# Patient Record
Sex: Male | Born: 1965
Health system: Southern US, Community
[De-identification: ages and names within clinical notes are randomized; demographics above are authoritative.]

## PROBLEM LIST (undated history)

## (undated) DIAGNOSIS — I10 Essential (primary) hypertension: Secondary | ICD-10-CM

## (undated) DIAGNOSIS — N201 Calculus of ureter: Secondary | ICD-10-CM

## (undated) DIAGNOSIS — E785 Hyperlipidemia, unspecified: Secondary | ICD-10-CM

## (undated) HISTORY — PX: SHOULDER SURGERY: SHX246

## (undated) HISTORY — PX: BACK SURGERY: SHX140

---

## 2002-05-19 ENCOUNTER — Encounter: Payer: Self-pay | Admitting: Internal Medicine

## 2002-05-19 ENCOUNTER — Ambulatory Visit (HOSPITAL_COMMUNITY): Admission: RE | Admit: 2002-05-19 | Discharge: 2002-05-19 | Payer: Self-pay | Admitting: Internal Medicine

## 2002-08-07 ENCOUNTER — Ambulatory Visit (HOSPITAL_BASED_OUTPATIENT_CLINIC_OR_DEPARTMENT_OTHER): Admission: RE | Admit: 2002-08-07 | Discharge: 2002-08-07 | Payer: Self-pay | Admitting: Otolaryngology

## 2002-08-07 ENCOUNTER — Encounter (INDEPENDENT_AMBULATORY_CARE_PROVIDER_SITE_OTHER): Payer: Self-pay | Admitting: *Deleted

## 2007-08-12 ENCOUNTER — Ambulatory Visit (HOSPITAL_COMMUNITY): Admission: RE | Admit: 2007-08-12 | Discharge: 2007-08-12 | Payer: Self-pay | Admitting: Internal Medicine

## 2008-08-03 ENCOUNTER — Emergency Department (HOSPITAL_COMMUNITY): Admission: EM | Admit: 2008-08-03 | Discharge: 2008-08-03 | Payer: Self-pay | Admitting: Emergency Medicine

## 2009-06-24 ENCOUNTER — Emergency Department (HOSPITAL_COMMUNITY): Admission: EM | Admit: 2009-06-24 | Discharge: 2009-06-24 | Payer: Self-pay | Admitting: Emergency Medicine

## 2009-12-16 ENCOUNTER — Ambulatory Visit (HOSPITAL_COMMUNITY): Admission: RE | Admit: 2009-12-16 | Discharge: 2009-12-16 | Payer: Self-pay | Admitting: Internal Medicine

## 2010-02-18 ENCOUNTER — Emergency Department (HOSPITAL_COMMUNITY): Admission: EM | Admit: 2010-02-18 | Discharge: 2010-02-18 | Payer: Self-pay | Admitting: Emergency Medicine

## 2010-07-28 ENCOUNTER — Emergency Department (HOSPITAL_COMMUNITY)
Admission: EM | Admit: 2010-07-28 | Discharge: 2010-07-29 | Payer: Self-pay | Source: Home / Self Care | Admitting: Emergency Medicine

## 2010-08-01 ENCOUNTER — Ambulatory Visit (HOSPITAL_COMMUNITY): Admission: RE | Admit: 2010-08-01 | Payer: Self-pay | Source: Home / Self Care | Admitting: Orthopedic Surgery

## 2010-08-01 ENCOUNTER — Ambulatory Visit (HOSPITAL_COMMUNITY)
Admission: RE | Admit: 2010-08-01 | Discharge: 2010-08-01 | Payer: Self-pay | Source: Home / Self Care | Attending: Orthopedic Surgery | Admitting: Orthopedic Surgery

## 2010-11-02 ENCOUNTER — Emergency Department (HOSPITAL_COMMUNITY)
Admission: EM | Admit: 2010-11-02 | Discharge: 2010-11-02 | Disposition: A | Discharge status: Home / Self Care | Payer: 59 | Attending: Emergency Medicine | Admitting: Emergency Medicine

## 2010-11-02 DIAGNOSIS — R42 Dizziness and giddiness: Secondary | ICD-10-CM | POA: Insufficient documentation

## 2010-11-02 DIAGNOSIS — I10 Essential (primary) hypertension: Secondary | ICD-10-CM | POA: Insufficient documentation

## 2010-11-02 LAB — BASIC METABOLIC PANEL
Chloride: 103 mEq/L (ref 96–112)
Creatinine, Ser: 0.74 mg/dL (ref 0.4–1.5)
GFR calc Af Amer: >60 mL/min (ref >60)
Potassium: 4.2 mEq/L (ref 3.5–5.1)

## 2010-11-02 LAB — DIFFERENTIAL
Basophils Absolute: 0 10*3/uL (ref 0.0–0.1)
Basophils Relative: 1 % (ref 0–1)
Eosinophils Absolute: 0.1 10*3/uL (ref 0.0–0.7)
Eosinophils Relative: 3 % (ref 0–5)
Lymphocytes Relative: 28 % (ref 12–46)
Lymphs Abs: 1.2 10*3/uL (ref 0.7–4.0)
Monocytes Absolute: 0.6 10*3/uL (ref 0.1–1.0)
Monocytes Relative: 13 % — ABNORMAL HIGH (ref 3–12)
Neutro Abs: 2.3 10*3/uL (ref 1.7–7.7)
Neutrophils Relative %: 55 % (ref 43–77)

## 2010-11-02 LAB — CBC
Platelets: 187 10*3/uL (ref 150–400)
RBC: 5.02 MIL/uL (ref 4.22–5.81)
WBC: 4.2 10*3/uL (ref 4.0–10.5)

## 2010-12-15 NOTE — Op Note (Signed)
   NAME:  MARQUIST, BINSTOCK                        ACCOUNT NO.:  [id number]   MEDICAL RECORD NO.:  [id number]                   PATIENT TYPE:  AMB   LOCATION:  DSC                                  FACILITY:  [facility]   PHYSICIAN:  Kristine Garbe. Ezzard Standing, M.D.         DATE OF BIRTH:  [birth year]   DATE OF PROCEDURE:  08/07/2002  DATE OF DISCHARGE:                                 OPERATIVE REPORT   PREOPERATIVE DIAGNOSIS:  Left vocal cord lesion.   POSTOPERATIVE DIAGNOSIS:  Left vocal cord lesion.   OPERATION:  Direct laryngoscopy and biopsy.   SURGEON:  Kristine Garbe. Ezzard Standing, M.D.   ANESTHESIA:  General endotracheal.   COMPLICATIONS:  None.   BRIEF CLINICAL NOTE:  Logan Solis is a 45 year old gentleman who has had  a soreness in the left side of his throat now for about 3 or 4 months.  On  examination in my office, he has a white, erythematous lesion over the  posterior left vocal cord over the arytenoid process region.  He is taken to  the operating room at this time for direct laryngoscopy and biopsy as this  has not responded to voice rest and antacid therapy.   DESCRIPTION OF PROCEDURE:  After adequate endotracheal anesthesia, direct  laryngoscopy was performed.  Base of tongue and epiglottis were normal to  evaluation.  Both piriform sinuses were clear.  AE folds were normal.  On  evaluation of the vocal cords, the anterior vocal cords were normal in  appearance bilaterally.  Over the posterior left vocal cord over the  arytenoid process there was an erythematous, slightly raised lesion.  Biopsy  was obtained from the erythematous raised lesion and sent to Pathology.  Photos were obtained prior to biopsy.  This completed the procedure.  [name]  was subsequently awakened from anesthesia and transferred to the recovery  room postoperatively doing well.   DISPOSITION:  [name] is discharged home early this morning on Tylenol for  pain.  Placed him on Protonix 40 mg daily for 2  weeks, as he had some  difficulty tolerating the Nexium in the past.  We will have him follow up in  my office in 10 to 14 days for re-check and to review pathology.                                                Kristine Garbe. Ezzard Standing, M.D.    CEN/MEDQ  D:  08/07/2002  T:  08/07/2002  Job:  [id number]

## 2017-06-03 DIAGNOSIS — J3081 Allergic rhinitis due to animal (cat) (dog) hair and dander: Secondary | ICD-10-CM | POA: Diagnosis not present

## 2017-06-03 DIAGNOSIS — J301 Allergic rhinitis due to pollen: Secondary | ICD-10-CM | POA: Diagnosis not present

## 2017-06-03 DIAGNOSIS — J3089 Other allergic rhinitis: Secondary | ICD-10-CM | POA: Diagnosis not present

## 2017-06-04 DIAGNOSIS — M47816 Spondylosis without myelopathy or radiculopathy, lumbar region: Secondary | ICD-10-CM | POA: Diagnosis not present

## 2017-06-04 DIAGNOSIS — Z6830 Body mass index (BMI) 30.0-30.9, adult: Secondary | ICD-10-CM | POA: Diagnosis not present

## 2017-06-04 DIAGNOSIS — M5126 Other intervertebral disc displacement, lumbar region: Secondary | ICD-10-CM | POA: Diagnosis not present

## 2017-06-10 DIAGNOSIS — J3089 Other allergic rhinitis: Secondary | ICD-10-CM | POA: Diagnosis not present

## 2017-06-10 DIAGNOSIS — J301 Allergic rhinitis due to pollen: Secondary | ICD-10-CM | POA: Diagnosis not present

## 2017-06-10 DIAGNOSIS — J3081 Allergic rhinitis due to animal (cat) (dog) hair and dander: Secondary | ICD-10-CM | POA: Diagnosis not present

## 2017-06-17 DIAGNOSIS — J301 Allergic rhinitis due to pollen: Secondary | ICD-10-CM | POA: Diagnosis not present

## 2017-06-17 DIAGNOSIS — J3081 Allergic rhinitis due to animal (cat) (dog) hair and dander: Secondary | ICD-10-CM | POA: Diagnosis not present

## 2017-06-17 DIAGNOSIS — J3089 Other allergic rhinitis: Secondary | ICD-10-CM | POA: Diagnosis not present

## 2017-06-24 DIAGNOSIS — J301 Allergic rhinitis due to pollen: Secondary | ICD-10-CM | POA: Diagnosis not present

## 2017-06-24 DIAGNOSIS — J3089 Other allergic rhinitis: Secondary | ICD-10-CM | POA: Diagnosis not present

## 2017-06-24 DIAGNOSIS — J3081 Allergic rhinitis due to animal (cat) (dog) hair and dander: Secondary | ICD-10-CM | POA: Diagnosis not present

## 2017-07-01 DIAGNOSIS — J3081 Allergic rhinitis due to animal (cat) (dog) hair and dander: Secondary | ICD-10-CM | POA: Diagnosis not present

## 2017-07-01 DIAGNOSIS — J3089 Other allergic rhinitis: Secondary | ICD-10-CM | POA: Diagnosis not present

## 2017-07-01 DIAGNOSIS — J301 Allergic rhinitis due to pollen: Secondary | ICD-10-CM | POA: Diagnosis not present

## 2017-07-09 DIAGNOSIS — J3089 Other allergic rhinitis: Secondary | ICD-10-CM | POA: Diagnosis not present

## 2017-07-09 DIAGNOSIS — J3081 Allergic rhinitis due to animal (cat) (dog) hair and dander: Secondary | ICD-10-CM | POA: Diagnosis not present

## 2017-07-09 DIAGNOSIS — J301 Allergic rhinitis due to pollen: Secondary | ICD-10-CM | POA: Diagnosis not present

## 2017-07-16 DIAGNOSIS — J3089 Other allergic rhinitis: Secondary | ICD-10-CM | POA: Diagnosis not present

## 2017-07-16 DIAGNOSIS — J301 Allergic rhinitis due to pollen: Secondary | ICD-10-CM | POA: Diagnosis not present

## 2017-07-16 DIAGNOSIS — J3081 Allergic rhinitis due to animal (cat) (dog) hair and dander: Secondary | ICD-10-CM | POA: Diagnosis not present

## 2017-07-16 DIAGNOSIS — I10 Essential (primary) hypertension: Secondary | ICD-10-CM | POA: Diagnosis not present

## 2017-07-16 DIAGNOSIS — F419 Anxiety disorder, unspecified: Secondary | ICD-10-CM | POA: Diagnosis not present

## 2017-07-24 DIAGNOSIS — J3081 Allergic rhinitis due to animal (cat) (dog) hair and dander: Secondary | ICD-10-CM | POA: Diagnosis not present

## 2017-07-24 DIAGNOSIS — J301 Allergic rhinitis due to pollen: Secondary | ICD-10-CM | POA: Diagnosis not present

## 2017-07-24 DIAGNOSIS — J3089 Other allergic rhinitis: Secondary | ICD-10-CM | POA: Diagnosis not present

## 2017-07-29 DIAGNOSIS — J301 Allergic rhinitis due to pollen: Secondary | ICD-10-CM | POA: Diagnosis not present

## 2017-07-29 DIAGNOSIS — J3089 Other allergic rhinitis: Secondary | ICD-10-CM | POA: Diagnosis not present

## 2017-07-29 DIAGNOSIS — J3081 Allergic rhinitis due to animal (cat) (dog) hair and dander: Secondary | ICD-10-CM | POA: Diagnosis not present

## 2017-08-06 DIAGNOSIS — J301 Allergic rhinitis due to pollen: Secondary | ICD-10-CM | POA: Diagnosis not present

## 2017-08-06 DIAGNOSIS — J3081 Allergic rhinitis due to animal (cat) (dog) hair and dander: Secondary | ICD-10-CM | POA: Diagnosis not present

## 2017-08-06 DIAGNOSIS — J3089 Other allergic rhinitis: Secondary | ICD-10-CM | POA: Diagnosis not present

## 2017-08-12 DIAGNOSIS — J3089 Other allergic rhinitis: Secondary | ICD-10-CM | POA: Diagnosis not present

## 2017-08-12 DIAGNOSIS — J3081 Allergic rhinitis due to animal (cat) (dog) hair and dander: Secondary | ICD-10-CM | POA: Diagnosis not present

## 2017-08-12 DIAGNOSIS — J301 Allergic rhinitis due to pollen: Secondary | ICD-10-CM | POA: Diagnosis not present

## 2017-08-19 DIAGNOSIS — J3089 Other allergic rhinitis: Secondary | ICD-10-CM | POA: Diagnosis not present

## 2017-08-19 DIAGNOSIS — J3081 Allergic rhinitis due to animal (cat) (dog) hair and dander: Secondary | ICD-10-CM | POA: Diagnosis not present

## 2017-08-19 DIAGNOSIS — J301 Allergic rhinitis due to pollen: Secondary | ICD-10-CM | POA: Diagnosis not present

## 2017-08-26 DIAGNOSIS — J3081 Allergic rhinitis due to animal (cat) (dog) hair and dander: Secondary | ICD-10-CM | POA: Diagnosis not present

## 2017-08-26 DIAGNOSIS — J301 Allergic rhinitis due to pollen: Secondary | ICD-10-CM | POA: Diagnosis not present

## 2017-08-26 DIAGNOSIS — J3089 Other allergic rhinitis: Secondary | ICD-10-CM | POA: Diagnosis not present

## 2017-09-06 DIAGNOSIS — J3081 Allergic rhinitis due to animal (cat) (dog) hair and dander: Secondary | ICD-10-CM | POA: Diagnosis not present

## 2017-09-06 DIAGNOSIS — J3089 Other allergic rhinitis: Secondary | ICD-10-CM | POA: Diagnosis not present

## 2017-09-06 DIAGNOSIS — J301 Allergic rhinitis due to pollen: Secondary | ICD-10-CM | POA: Diagnosis not present

## 2017-09-10 DIAGNOSIS — J3089 Other allergic rhinitis: Secondary | ICD-10-CM | POA: Diagnosis not present

## 2017-09-10 DIAGNOSIS — J3081 Allergic rhinitis due to animal (cat) (dog) hair and dander: Secondary | ICD-10-CM | POA: Diagnosis not present

## 2017-09-10 DIAGNOSIS — J301 Allergic rhinitis due to pollen: Secondary | ICD-10-CM | POA: Diagnosis not present

## 2017-09-17 DIAGNOSIS — J3089 Other allergic rhinitis: Secondary | ICD-10-CM | POA: Diagnosis not present

## 2017-09-17 DIAGNOSIS — J3081 Allergic rhinitis due to animal (cat) (dog) hair and dander: Secondary | ICD-10-CM | POA: Diagnosis not present

## 2017-09-17 DIAGNOSIS — J301 Allergic rhinitis due to pollen: Secondary | ICD-10-CM | POA: Diagnosis not present

## 2017-09-23 DIAGNOSIS — J301 Allergic rhinitis due to pollen: Secondary | ICD-10-CM | POA: Diagnosis not present

## 2017-09-23 DIAGNOSIS — J3089 Other allergic rhinitis: Secondary | ICD-10-CM | POA: Diagnosis not present

## 2017-09-23 DIAGNOSIS — J3081 Allergic rhinitis due to animal (cat) (dog) hair and dander: Secondary | ICD-10-CM | POA: Diagnosis not present

## 2017-10-01 DIAGNOSIS — J019 Acute sinusitis, unspecified: Secondary | ICD-10-CM | POA: Diagnosis not present

## 2017-10-03 DIAGNOSIS — J301 Allergic rhinitis due to pollen: Secondary | ICD-10-CM | POA: Diagnosis not present

## 2017-10-07 DIAGNOSIS — J301 Allergic rhinitis due to pollen: Secondary | ICD-10-CM | POA: Diagnosis not present

## 2017-10-07 DIAGNOSIS — J3089 Other allergic rhinitis: Secondary | ICD-10-CM | POA: Diagnosis not present

## 2017-10-07 DIAGNOSIS — J3081 Allergic rhinitis due to animal (cat) (dog) hair and dander: Secondary | ICD-10-CM | POA: Diagnosis not present

## 2017-10-08 DIAGNOSIS — H6123 Impacted cerumen, bilateral: Secondary | ICD-10-CM | POA: Diagnosis not present

## 2017-10-14 DIAGNOSIS — J301 Allergic rhinitis due to pollen: Secondary | ICD-10-CM | POA: Diagnosis not present

## 2017-10-14 DIAGNOSIS — J3081 Allergic rhinitis due to animal (cat) (dog) hair and dander: Secondary | ICD-10-CM | POA: Diagnosis not present

## 2017-10-14 DIAGNOSIS — J3089 Other allergic rhinitis: Secondary | ICD-10-CM | POA: Diagnosis not present

## 2017-10-21 DIAGNOSIS — J3081 Allergic rhinitis due to animal (cat) (dog) hair and dander: Secondary | ICD-10-CM | POA: Diagnosis not present

## 2017-10-21 DIAGNOSIS — J301 Allergic rhinitis due to pollen: Secondary | ICD-10-CM | POA: Diagnosis not present

## 2017-10-21 DIAGNOSIS — J3089 Other allergic rhinitis: Secondary | ICD-10-CM | POA: Diagnosis not present

## 2017-10-28 DIAGNOSIS — J301 Allergic rhinitis due to pollen: Secondary | ICD-10-CM | POA: Diagnosis not present

## 2017-10-28 DIAGNOSIS — J3089 Other allergic rhinitis: Secondary | ICD-10-CM | POA: Diagnosis not present

## 2017-10-28 DIAGNOSIS — J3081 Allergic rhinitis due to animal (cat) (dog) hair and dander: Secondary | ICD-10-CM | POA: Diagnosis not present

## 2017-11-03 DIAGNOSIS — H6501 Acute serous otitis media, right ear: Secondary | ICD-10-CM | POA: Diagnosis not present

## 2017-11-03 DIAGNOSIS — R509 Fever, unspecified: Secondary | ICD-10-CM | POA: Diagnosis not present

## 2017-11-03 DIAGNOSIS — J060 Acute laryngopharyngitis: Secondary | ICD-10-CM | POA: Diagnosis not present

## 2017-11-11 DIAGNOSIS — J3081 Allergic rhinitis due to animal (cat) (dog) hair and dander: Secondary | ICD-10-CM | POA: Diagnosis not present

## 2017-11-11 DIAGNOSIS — J3089 Other allergic rhinitis: Secondary | ICD-10-CM | POA: Diagnosis not present

## 2017-11-11 DIAGNOSIS — J301 Allergic rhinitis due to pollen: Secondary | ICD-10-CM | POA: Diagnosis not present

## 2017-11-18 DIAGNOSIS — J3089 Other allergic rhinitis: Secondary | ICD-10-CM | POA: Diagnosis not present

## 2017-11-18 DIAGNOSIS — J3081 Allergic rhinitis due to animal (cat) (dog) hair and dander: Secondary | ICD-10-CM | POA: Diagnosis not present

## 2017-11-18 DIAGNOSIS — J301 Allergic rhinitis due to pollen: Secondary | ICD-10-CM | POA: Diagnosis not present

## 2017-11-19 DIAGNOSIS — Z6830 Body mass index (BMI) 30.0-30.9, adult: Secondary | ICD-10-CM | POA: Diagnosis not present

## 2017-11-19 DIAGNOSIS — F419 Anxiety disorder, unspecified: Secondary | ICD-10-CM | POA: Diagnosis not present

## 2017-11-19 DIAGNOSIS — I10 Essential (primary) hypertension: Secondary | ICD-10-CM | POA: Diagnosis not present

## 2017-11-25 DIAGNOSIS — J301 Allergic rhinitis due to pollen: Secondary | ICD-10-CM | POA: Diagnosis not present

## 2017-11-25 DIAGNOSIS — J3089 Other allergic rhinitis: Secondary | ICD-10-CM | POA: Diagnosis not present

## 2017-11-25 DIAGNOSIS — J3081 Allergic rhinitis due to animal (cat) (dog) hair and dander: Secondary | ICD-10-CM | POA: Diagnosis not present

## 2017-12-02 DIAGNOSIS — J3081 Allergic rhinitis due to animal (cat) (dog) hair and dander: Secondary | ICD-10-CM | POA: Diagnosis not present

## 2017-12-02 DIAGNOSIS — J3089 Other allergic rhinitis: Secondary | ICD-10-CM | POA: Diagnosis not present

## 2017-12-02 DIAGNOSIS — J301 Allergic rhinitis due to pollen: Secondary | ICD-10-CM | POA: Diagnosis not present

## 2017-12-09 DIAGNOSIS — J3089 Other allergic rhinitis: Secondary | ICD-10-CM | POA: Diagnosis not present

## 2017-12-09 DIAGNOSIS — J3081 Allergic rhinitis due to animal (cat) (dog) hair and dander: Secondary | ICD-10-CM | POA: Diagnosis not present

## 2017-12-09 DIAGNOSIS — J301 Allergic rhinitis due to pollen: Secondary | ICD-10-CM | POA: Diagnosis not present

## 2017-12-16 DIAGNOSIS — J3081 Allergic rhinitis due to animal (cat) (dog) hair and dander: Secondary | ICD-10-CM | POA: Diagnosis not present

## 2017-12-16 DIAGNOSIS — J3089 Other allergic rhinitis: Secondary | ICD-10-CM | POA: Diagnosis not present

## 2017-12-16 DIAGNOSIS — J301 Allergic rhinitis due to pollen: Secondary | ICD-10-CM | POA: Diagnosis not present

## 2017-12-24 DIAGNOSIS — J3089 Other allergic rhinitis: Secondary | ICD-10-CM | POA: Diagnosis not present

## 2017-12-24 DIAGNOSIS — J3081 Allergic rhinitis due to animal (cat) (dog) hair and dander: Secondary | ICD-10-CM | POA: Diagnosis not present

## 2017-12-24 DIAGNOSIS — J301 Allergic rhinitis due to pollen: Secondary | ICD-10-CM | POA: Diagnosis not present

## 2017-12-27 DIAGNOSIS — J3081 Allergic rhinitis due to animal (cat) (dog) hair and dander: Secondary | ICD-10-CM | POA: Diagnosis not present

## 2017-12-27 DIAGNOSIS — J3089 Other allergic rhinitis: Secondary | ICD-10-CM | POA: Diagnosis not present

## 2017-12-31 DIAGNOSIS — J3089 Other allergic rhinitis: Secondary | ICD-10-CM | POA: Diagnosis not present

## 2017-12-31 DIAGNOSIS — J3081 Allergic rhinitis due to animal (cat) (dog) hair and dander: Secondary | ICD-10-CM | POA: Diagnosis not present

## 2017-12-31 DIAGNOSIS — J301 Allergic rhinitis due to pollen: Secondary | ICD-10-CM | POA: Diagnosis not present

## 2018-01-01 DIAGNOSIS — J301 Allergic rhinitis due to pollen: Secondary | ICD-10-CM | POA: Diagnosis not present

## 2018-01-01 DIAGNOSIS — J3089 Other allergic rhinitis: Secondary | ICD-10-CM | POA: Diagnosis not present

## 2018-01-01 DIAGNOSIS — R05 Cough: Secondary | ICD-10-CM | POA: Diagnosis not present

## 2018-01-01 DIAGNOSIS — J3081 Allergic rhinitis due to animal (cat) (dog) hair and dander: Secondary | ICD-10-CM | POA: Diagnosis not present

## 2018-01-09 DIAGNOSIS — J301 Allergic rhinitis due to pollen: Secondary | ICD-10-CM | POA: Diagnosis not present

## 2018-01-09 DIAGNOSIS — J3089 Other allergic rhinitis: Secondary | ICD-10-CM | POA: Diagnosis not present

## 2018-01-09 DIAGNOSIS — J3081 Allergic rhinitis due to animal (cat) (dog) hair and dander: Secondary | ICD-10-CM | POA: Diagnosis not present

## 2018-01-16 DIAGNOSIS — M5126 Other intervertebral disc displacement, lumbar region: Secondary | ICD-10-CM | POA: Diagnosis not present

## 2018-01-16 DIAGNOSIS — Z6829 Body mass index (BMI) 29.0-29.9, adult: Secondary | ICD-10-CM | POA: Diagnosis not present

## 2018-01-21 DIAGNOSIS — J3089 Other allergic rhinitis: Secondary | ICD-10-CM | POA: Diagnosis not present

## 2018-01-21 DIAGNOSIS — J3081 Allergic rhinitis due to animal (cat) (dog) hair and dander: Secondary | ICD-10-CM | POA: Diagnosis not present

## 2018-01-21 DIAGNOSIS — J301 Allergic rhinitis due to pollen: Secondary | ICD-10-CM | POA: Diagnosis not present

## 2018-01-28 DIAGNOSIS — J301 Allergic rhinitis due to pollen: Secondary | ICD-10-CM | POA: Diagnosis not present

## 2018-01-28 DIAGNOSIS — J3081 Allergic rhinitis due to animal (cat) (dog) hair and dander: Secondary | ICD-10-CM | POA: Diagnosis not present

## 2018-01-28 DIAGNOSIS — J3089 Other allergic rhinitis: Secondary | ICD-10-CM | POA: Diagnosis not present

## 2018-02-04 DIAGNOSIS — J3081 Allergic rhinitis due to animal (cat) (dog) hair and dander: Secondary | ICD-10-CM | POA: Diagnosis not present

## 2018-02-04 DIAGNOSIS — J3089 Other allergic rhinitis: Secondary | ICD-10-CM | POA: Diagnosis not present

## 2018-02-04 DIAGNOSIS — J301 Allergic rhinitis due to pollen: Secondary | ICD-10-CM | POA: Diagnosis not present

## 2018-02-12 DIAGNOSIS — J3089 Other allergic rhinitis: Secondary | ICD-10-CM | POA: Diagnosis not present

## 2018-02-12 DIAGNOSIS — J301 Allergic rhinitis due to pollen: Secondary | ICD-10-CM | POA: Diagnosis not present

## 2018-02-18 DIAGNOSIS — J3081 Allergic rhinitis due to animal (cat) (dog) hair and dander: Secondary | ICD-10-CM | POA: Diagnosis not present

## 2018-02-18 DIAGNOSIS — J3089 Other allergic rhinitis: Secondary | ICD-10-CM | POA: Diagnosis not present

## 2018-02-18 DIAGNOSIS — J301 Allergic rhinitis due to pollen: Secondary | ICD-10-CM | POA: Diagnosis not present

## 2018-02-25 DIAGNOSIS — J3089 Other allergic rhinitis: Secondary | ICD-10-CM | POA: Diagnosis not present

## 2018-02-25 DIAGNOSIS — J3081 Allergic rhinitis due to animal (cat) (dog) hair and dander: Secondary | ICD-10-CM | POA: Diagnosis not present

## 2018-02-25 DIAGNOSIS — J301 Allergic rhinitis due to pollen: Secondary | ICD-10-CM | POA: Diagnosis not present

## 2018-03-04 DIAGNOSIS — J3089 Other allergic rhinitis: Secondary | ICD-10-CM | POA: Diagnosis not present

## 2018-03-04 DIAGNOSIS — J301 Allergic rhinitis due to pollen: Secondary | ICD-10-CM | POA: Diagnosis not present

## 2018-03-04 DIAGNOSIS — J3081 Allergic rhinitis due to animal (cat) (dog) hair and dander: Secondary | ICD-10-CM | POA: Diagnosis not present

## 2018-03-14 DIAGNOSIS — J3089 Other allergic rhinitis: Secondary | ICD-10-CM | POA: Diagnosis not present

## 2018-03-14 DIAGNOSIS — J301 Allergic rhinitis due to pollen: Secondary | ICD-10-CM | POA: Diagnosis not present

## 2018-03-14 DIAGNOSIS — J3081 Allergic rhinitis due to animal (cat) (dog) hair and dander: Secondary | ICD-10-CM | POA: Diagnosis not present

## 2018-03-19 DIAGNOSIS — J3089 Other allergic rhinitis: Secondary | ICD-10-CM | POA: Diagnosis not present

## 2018-03-19 DIAGNOSIS — J3081 Allergic rhinitis due to animal (cat) (dog) hair and dander: Secondary | ICD-10-CM | POA: Diagnosis not present

## 2018-03-19 DIAGNOSIS — J301 Allergic rhinitis due to pollen: Secondary | ICD-10-CM | POA: Diagnosis not present

## 2018-03-20 DIAGNOSIS — I10 Essential (primary) hypertension: Secondary | ICD-10-CM | POA: Diagnosis not present

## 2018-03-20 DIAGNOSIS — F419 Anxiety disorder, unspecified: Secondary | ICD-10-CM | POA: Diagnosis not present

## 2018-03-20 DIAGNOSIS — E785 Hyperlipidemia, unspecified: Secondary | ICD-10-CM | POA: Diagnosis not present

## 2018-03-20 DIAGNOSIS — Z79899 Other long term (current) drug therapy: Secondary | ICD-10-CM | POA: Diagnosis not present

## 2018-03-20 DIAGNOSIS — Z125 Encounter for screening for malignant neoplasm of prostate: Secondary | ICD-10-CM | POA: Diagnosis not present

## 2018-03-26 DIAGNOSIS — J3089 Other allergic rhinitis: Secondary | ICD-10-CM | POA: Diagnosis not present

## 2018-03-26 DIAGNOSIS — J301 Allergic rhinitis due to pollen: Secondary | ICD-10-CM | POA: Diagnosis not present

## 2018-03-26 DIAGNOSIS — J3081 Allergic rhinitis due to animal (cat) (dog) hair and dander: Secondary | ICD-10-CM | POA: Diagnosis not present

## 2018-03-26 DIAGNOSIS — R05 Cough: Secondary | ICD-10-CM | POA: Diagnosis not present

## 2018-04-01 DIAGNOSIS — R310 Gross hematuria: Secondary | ICD-10-CM | POA: Diagnosis not present

## 2018-04-01 DIAGNOSIS — I10 Essential (primary) hypertension: Secondary | ICD-10-CM | POA: Diagnosis not present

## 2018-04-02 DIAGNOSIS — J3089 Other allergic rhinitis: Secondary | ICD-10-CM | POA: Diagnosis not present

## 2018-04-02 DIAGNOSIS — J3081 Allergic rhinitis due to animal (cat) (dog) hair and dander: Secondary | ICD-10-CM | POA: Diagnosis not present

## 2018-04-02 DIAGNOSIS — J301 Allergic rhinitis due to pollen: Secondary | ICD-10-CM | POA: Diagnosis not present

## 2018-04-04 DIAGNOSIS — R319 Hematuria, unspecified: Secondary | ICD-10-CM | POA: Diagnosis not present

## 2018-04-04 DIAGNOSIS — N200 Calculus of kidney: Secondary | ICD-10-CM | POA: Diagnosis not present

## 2018-04-08 DIAGNOSIS — J3089 Other allergic rhinitis: Secondary | ICD-10-CM | POA: Diagnosis not present

## 2018-04-08 DIAGNOSIS — J3081 Allergic rhinitis due to animal (cat) (dog) hair and dander: Secondary | ICD-10-CM | POA: Diagnosis not present

## 2018-04-08 DIAGNOSIS — J301 Allergic rhinitis due to pollen: Secondary | ICD-10-CM | POA: Diagnosis not present

## 2018-04-16 DIAGNOSIS — J301 Allergic rhinitis due to pollen: Secondary | ICD-10-CM | POA: Diagnosis not present

## 2018-04-16 DIAGNOSIS — J3089 Other allergic rhinitis: Secondary | ICD-10-CM | POA: Diagnosis not present

## 2018-04-16 DIAGNOSIS — J3081 Allergic rhinitis due to animal (cat) (dog) hair and dander: Secondary | ICD-10-CM | POA: Diagnosis not present

## 2018-04-18 DIAGNOSIS — R319 Hematuria, unspecified: Secondary | ICD-10-CM | POA: Diagnosis not present

## 2018-04-21 DIAGNOSIS — J301 Allergic rhinitis due to pollen: Secondary | ICD-10-CM | POA: Diagnosis not present

## 2018-04-22 DIAGNOSIS — J3081 Allergic rhinitis due to animal (cat) (dog) hair and dander: Secondary | ICD-10-CM | POA: Diagnosis not present

## 2018-04-22 DIAGNOSIS — J3089 Other allergic rhinitis: Secondary | ICD-10-CM | POA: Diagnosis not present

## 2018-04-22 DIAGNOSIS — J301 Allergic rhinitis due to pollen: Secondary | ICD-10-CM | POA: Diagnosis not present

## 2018-04-24 DIAGNOSIS — Z23 Encounter for immunization: Secondary | ICD-10-CM | POA: Diagnosis not present

## 2018-04-29 DIAGNOSIS — J3089 Other allergic rhinitis: Secondary | ICD-10-CM | POA: Diagnosis not present

## 2018-04-29 DIAGNOSIS — J3081 Allergic rhinitis due to animal (cat) (dog) hair and dander: Secondary | ICD-10-CM | POA: Diagnosis not present

## 2018-04-29 DIAGNOSIS — J301 Allergic rhinitis due to pollen: Secondary | ICD-10-CM | POA: Diagnosis not present

## 2018-05-07 DIAGNOSIS — J3089 Other allergic rhinitis: Secondary | ICD-10-CM | POA: Diagnosis not present

## 2018-05-07 DIAGNOSIS — J301 Allergic rhinitis due to pollen: Secondary | ICD-10-CM | POA: Diagnosis not present

## 2018-05-07 DIAGNOSIS — J3081 Allergic rhinitis due to animal (cat) (dog) hair and dander: Secondary | ICD-10-CM | POA: Diagnosis not present

## 2018-05-13 DIAGNOSIS — J301 Allergic rhinitis due to pollen: Secondary | ICD-10-CM | POA: Diagnosis not present

## 2018-05-13 DIAGNOSIS — J3081 Allergic rhinitis due to animal (cat) (dog) hair and dander: Secondary | ICD-10-CM | POA: Diagnosis not present

## 2018-05-13 DIAGNOSIS — J3089 Other allergic rhinitis: Secondary | ICD-10-CM | POA: Diagnosis not present

## 2018-05-20 DIAGNOSIS — J3089 Other allergic rhinitis: Secondary | ICD-10-CM | POA: Diagnosis not present

## 2018-05-20 DIAGNOSIS — J3081 Allergic rhinitis due to animal (cat) (dog) hair and dander: Secondary | ICD-10-CM | POA: Diagnosis not present

## 2018-05-20 DIAGNOSIS — J301 Allergic rhinitis due to pollen: Secondary | ICD-10-CM | POA: Diagnosis not present

## 2018-05-27 DIAGNOSIS — J301 Allergic rhinitis due to pollen: Secondary | ICD-10-CM | POA: Diagnosis not present

## 2018-05-27 DIAGNOSIS — J3081 Allergic rhinitis due to animal (cat) (dog) hair and dander: Secondary | ICD-10-CM | POA: Diagnosis not present

## 2018-05-27 DIAGNOSIS — J3089 Other allergic rhinitis: Secondary | ICD-10-CM | POA: Diagnosis not present

## 2018-06-02 DIAGNOSIS — J3081 Allergic rhinitis due to animal (cat) (dog) hair and dander: Secondary | ICD-10-CM | POA: Diagnosis not present

## 2018-06-02 DIAGNOSIS — J3089 Other allergic rhinitis: Secondary | ICD-10-CM | POA: Diagnosis not present

## 2018-06-02 DIAGNOSIS — J301 Allergic rhinitis due to pollen: Secondary | ICD-10-CM | POA: Diagnosis not present

## 2018-06-09 DIAGNOSIS — J301 Allergic rhinitis due to pollen: Secondary | ICD-10-CM | POA: Diagnosis not present

## 2018-06-09 DIAGNOSIS — J3089 Other allergic rhinitis: Secondary | ICD-10-CM | POA: Diagnosis not present

## 2018-06-09 DIAGNOSIS — J3081 Allergic rhinitis due to animal (cat) (dog) hair and dander: Secondary | ICD-10-CM | POA: Diagnosis not present

## 2018-06-17 DIAGNOSIS — J3081 Allergic rhinitis due to animal (cat) (dog) hair and dander: Secondary | ICD-10-CM | POA: Diagnosis not present

## 2018-06-17 DIAGNOSIS — J301 Allergic rhinitis due to pollen: Secondary | ICD-10-CM | POA: Diagnosis not present

## 2018-06-17 DIAGNOSIS — J3089 Other allergic rhinitis: Secondary | ICD-10-CM | POA: Diagnosis not present

## 2018-06-23 DIAGNOSIS — J3089 Other allergic rhinitis: Secondary | ICD-10-CM | POA: Diagnosis not present

## 2018-06-23 DIAGNOSIS — J3081 Allergic rhinitis due to animal (cat) (dog) hair and dander: Secondary | ICD-10-CM | POA: Diagnosis not present

## 2018-06-23 DIAGNOSIS — J301 Allergic rhinitis due to pollen: Secondary | ICD-10-CM | POA: Diagnosis not present

## 2018-06-30 DIAGNOSIS — J3081 Allergic rhinitis due to animal (cat) (dog) hair and dander: Secondary | ICD-10-CM | POA: Diagnosis not present

## 2018-06-30 DIAGNOSIS — J3089 Other allergic rhinitis: Secondary | ICD-10-CM | POA: Diagnosis not present

## 2018-06-30 DIAGNOSIS — J301 Allergic rhinitis due to pollen: Secondary | ICD-10-CM | POA: Diagnosis not present

## 2018-07-01 DIAGNOSIS — M545 Low back pain: Secondary | ICD-10-CM | POA: Diagnosis not present

## 2018-07-01 DIAGNOSIS — I10 Essential (primary) hypertension: Secondary | ICD-10-CM | POA: Diagnosis not present

## 2018-07-01 DIAGNOSIS — Z6830 Body mass index (BMI) 30.0-30.9, adult: Secondary | ICD-10-CM | POA: Diagnosis not present

## 2018-07-07 DIAGNOSIS — J301 Allergic rhinitis due to pollen: Secondary | ICD-10-CM | POA: Diagnosis not present

## 2018-07-07 DIAGNOSIS — J3081 Allergic rhinitis due to animal (cat) (dog) hair and dander: Secondary | ICD-10-CM | POA: Diagnosis not present

## 2018-07-07 DIAGNOSIS — J3089 Other allergic rhinitis: Secondary | ICD-10-CM | POA: Diagnosis not present

## 2018-07-11 DIAGNOSIS — M5136 Other intervertebral disc degeneration, lumbar region: Secondary | ICD-10-CM | POA: Diagnosis not present

## 2018-07-15 DIAGNOSIS — J060 Acute laryngopharyngitis: Secondary | ICD-10-CM | POA: Diagnosis not present

## 2018-07-17 DIAGNOSIS — M5127 Other intervertebral disc displacement, lumbosacral region: Secondary | ICD-10-CM | POA: Diagnosis not present

## 2018-07-17 DIAGNOSIS — M545 Low back pain: Secondary | ICD-10-CM | POA: Diagnosis not present

## 2018-07-17 DIAGNOSIS — M47816 Spondylosis without myelopathy or radiculopathy, lumbar region: Secondary | ICD-10-CM | POA: Diagnosis not present

## 2018-07-21 DIAGNOSIS — J3089 Other allergic rhinitis: Secondary | ICD-10-CM | POA: Diagnosis not present

## 2018-07-21 DIAGNOSIS — J301 Allergic rhinitis due to pollen: Secondary | ICD-10-CM | POA: Diagnosis not present

## 2018-07-21 DIAGNOSIS — J3081 Allergic rhinitis due to animal (cat) (dog) hair and dander: Secondary | ICD-10-CM | POA: Diagnosis not present

## 2018-07-28 DIAGNOSIS — J301 Allergic rhinitis due to pollen: Secondary | ICD-10-CM | POA: Diagnosis not present

## 2018-07-28 DIAGNOSIS — J3089 Other allergic rhinitis: Secondary | ICD-10-CM | POA: Diagnosis not present

## 2018-07-28 DIAGNOSIS — J3081 Allergic rhinitis due to animal (cat) (dog) hair and dander: Secondary | ICD-10-CM | POA: Diagnosis not present

## 2018-07-31 DIAGNOSIS — I10 Essential (primary) hypertension: Secondary | ICD-10-CM | POA: Diagnosis not present

## 2018-07-31 DIAGNOSIS — N200 Calculus of kidney: Secondary | ICD-10-CM | POA: Diagnosis not present

## 2018-08-05 DIAGNOSIS — J3089 Other allergic rhinitis: Secondary | ICD-10-CM | POA: Diagnosis not present

## 2018-08-05 DIAGNOSIS — J3081 Allergic rhinitis due to animal (cat) (dog) hair and dander: Secondary | ICD-10-CM | POA: Diagnosis not present

## 2018-08-05 DIAGNOSIS — J301 Allergic rhinitis due to pollen: Secondary | ICD-10-CM | POA: Diagnosis not present

## 2018-08-07 DIAGNOSIS — M961 Postlaminectomy syndrome, not elsewhere classified: Secondary | ICD-10-CM | POA: Diagnosis not present

## 2018-08-07 DIAGNOSIS — M5126 Other intervertebral disc displacement, lumbar region: Secondary | ICD-10-CM | POA: Diagnosis not present

## 2018-08-07 DIAGNOSIS — M47816 Spondylosis without myelopathy or radiculopathy, lumbar region: Secondary | ICD-10-CM | POA: Diagnosis not present

## 2018-08-07 DIAGNOSIS — M5136 Other intervertebral disc degeneration, lumbar region: Secondary | ICD-10-CM | POA: Diagnosis not present

## 2018-08-12 DIAGNOSIS — J3081 Allergic rhinitis due to animal (cat) (dog) hair and dander: Secondary | ICD-10-CM | POA: Diagnosis not present

## 2018-08-12 DIAGNOSIS — J301 Allergic rhinitis due to pollen: Secondary | ICD-10-CM | POA: Diagnosis not present

## 2018-08-12 DIAGNOSIS — J3089 Other allergic rhinitis: Secondary | ICD-10-CM | POA: Diagnosis not present

## 2018-08-15 DIAGNOSIS — M5416 Radiculopathy, lumbar region: Secondary | ICD-10-CM | POA: Diagnosis not present

## 2018-08-15 DIAGNOSIS — M5126 Other intervertebral disc displacement, lumbar region: Secondary | ICD-10-CM | POA: Diagnosis not present

## 2018-08-19 DIAGNOSIS — J3081 Allergic rhinitis due to animal (cat) (dog) hair and dander: Secondary | ICD-10-CM | POA: Diagnosis not present

## 2018-08-19 DIAGNOSIS — M5124 Other intervertebral disc displacement, thoracic region: Secondary | ICD-10-CM | POA: Diagnosis not present

## 2018-08-19 DIAGNOSIS — J301 Allergic rhinitis due to pollen: Secondary | ICD-10-CM | POA: Diagnosis not present

## 2018-08-19 DIAGNOSIS — M5414 Radiculopathy, thoracic region: Secondary | ICD-10-CM | POA: Diagnosis not present

## 2018-08-19 DIAGNOSIS — J3089 Other allergic rhinitis: Secondary | ICD-10-CM | POA: Diagnosis not present

## 2018-08-19 DIAGNOSIS — M47814 Spondylosis without myelopathy or radiculopathy, thoracic region: Secondary | ICD-10-CM | POA: Diagnosis not present

## 2018-08-26 DIAGNOSIS — J3081 Allergic rhinitis due to animal (cat) (dog) hair and dander: Secondary | ICD-10-CM | POA: Diagnosis not present

## 2018-08-26 DIAGNOSIS — J3089 Other allergic rhinitis: Secondary | ICD-10-CM | POA: Diagnosis not present

## 2018-08-26 DIAGNOSIS — J301 Allergic rhinitis due to pollen: Secondary | ICD-10-CM | POA: Diagnosis not present

## 2018-09-08 DIAGNOSIS — J301 Allergic rhinitis due to pollen: Secondary | ICD-10-CM | POA: Diagnosis not present

## 2018-09-08 DIAGNOSIS — J3081 Allergic rhinitis due to animal (cat) (dog) hair and dander: Secondary | ICD-10-CM | POA: Diagnosis not present

## 2018-09-08 DIAGNOSIS — J3089 Other allergic rhinitis: Secondary | ICD-10-CM | POA: Diagnosis not present

## 2018-09-18 DIAGNOSIS — J3089 Other allergic rhinitis: Secondary | ICD-10-CM | POA: Diagnosis not present

## 2018-09-18 DIAGNOSIS — J3081 Allergic rhinitis due to animal (cat) (dog) hair and dander: Secondary | ICD-10-CM | POA: Diagnosis not present

## 2018-09-18 DIAGNOSIS — J301 Allergic rhinitis due to pollen: Secondary | ICD-10-CM | POA: Diagnosis not present

## 2018-09-24 DIAGNOSIS — J3081 Allergic rhinitis due to animal (cat) (dog) hair and dander: Secondary | ICD-10-CM | POA: Diagnosis not present

## 2018-09-24 DIAGNOSIS — J3089 Other allergic rhinitis: Secondary | ICD-10-CM | POA: Diagnosis not present

## 2018-09-24 DIAGNOSIS — J301 Allergic rhinitis due to pollen: Secondary | ICD-10-CM | POA: Diagnosis not present

## 2018-09-30 DIAGNOSIS — J301 Allergic rhinitis due to pollen: Secondary | ICD-10-CM | POA: Diagnosis not present

## 2018-09-30 DIAGNOSIS — J3081 Allergic rhinitis due to animal (cat) (dog) hair and dander: Secondary | ICD-10-CM | POA: Diagnosis not present

## 2018-09-30 DIAGNOSIS — J3089 Other allergic rhinitis: Secondary | ICD-10-CM | POA: Diagnosis not present

## 2018-10-07 DIAGNOSIS — J3081 Allergic rhinitis due to animal (cat) (dog) hair and dander: Secondary | ICD-10-CM | POA: Diagnosis not present

## 2018-10-07 DIAGNOSIS — J3089 Other allergic rhinitis: Secondary | ICD-10-CM | POA: Diagnosis not present

## 2018-10-07 DIAGNOSIS — J301 Allergic rhinitis due to pollen: Secondary | ICD-10-CM | POA: Diagnosis not present

## 2018-10-14 DIAGNOSIS — J3089 Other allergic rhinitis: Secondary | ICD-10-CM | POA: Diagnosis not present

## 2018-10-14 DIAGNOSIS — J301 Allergic rhinitis due to pollen: Secondary | ICD-10-CM | POA: Diagnosis not present

## 2018-10-14 DIAGNOSIS — J3081 Allergic rhinitis due to animal (cat) (dog) hair and dander: Secondary | ICD-10-CM | POA: Diagnosis not present

## 2018-10-21 DIAGNOSIS — J3089 Other allergic rhinitis: Secondary | ICD-10-CM | POA: Diagnosis not present

## 2018-10-21 DIAGNOSIS — J301 Allergic rhinitis due to pollen: Secondary | ICD-10-CM | POA: Diagnosis not present

## 2018-10-21 DIAGNOSIS — J3081 Allergic rhinitis due to animal (cat) (dog) hair and dander: Secondary | ICD-10-CM | POA: Diagnosis not present

## 2018-10-24 DIAGNOSIS — J301 Allergic rhinitis due to pollen: Secondary | ICD-10-CM | POA: Diagnosis not present

## 2018-10-28 DIAGNOSIS — J301 Allergic rhinitis due to pollen: Secondary | ICD-10-CM | POA: Diagnosis not present

## 2018-10-28 DIAGNOSIS — J3089 Other allergic rhinitis: Secondary | ICD-10-CM | POA: Diagnosis not present

## 2018-10-28 DIAGNOSIS — J3081 Allergic rhinitis due to animal (cat) (dog) hair and dander: Secondary | ICD-10-CM | POA: Diagnosis not present

## 2018-11-04 DIAGNOSIS — J3089 Other allergic rhinitis: Secondary | ICD-10-CM | POA: Diagnosis not present

## 2018-11-04 DIAGNOSIS — J3081 Allergic rhinitis due to animal (cat) (dog) hair and dander: Secondary | ICD-10-CM | POA: Diagnosis not present

## 2018-11-04 DIAGNOSIS — J301 Allergic rhinitis due to pollen: Secondary | ICD-10-CM | POA: Diagnosis not present

## 2018-11-11 DIAGNOSIS — J3089 Other allergic rhinitis: Secondary | ICD-10-CM | POA: Diagnosis not present

## 2018-11-11 DIAGNOSIS — J3081 Allergic rhinitis due to animal (cat) (dog) hair and dander: Secondary | ICD-10-CM | POA: Diagnosis not present

## 2018-11-11 DIAGNOSIS — J301 Allergic rhinitis due to pollen: Secondary | ICD-10-CM | POA: Diagnosis not present

## 2018-11-18 DIAGNOSIS — J3089 Other allergic rhinitis: Secondary | ICD-10-CM | POA: Diagnosis not present

## 2018-11-18 DIAGNOSIS — J3081 Allergic rhinitis due to animal (cat) (dog) hair and dander: Secondary | ICD-10-CM | POA: Diagnosis not present

## 2018-11-18 DIAGNOSIS — J301 Allergic rhinitis due to pollen: Secondary | ICD-10-CM | POA: Diagnosis not present

## 2018-11-19 DIAGNOSIS — J3081 Allergic rhinitis due to animal (cat) (dog) hair and dander: Secondary | ICD-10-CM | POA: Diagnosis not present

## 2018-11-19 DIAGNOSIS — J3089 Other allergic rhinitis: Secondary | ICD-10-CM | POA: Diagnosis not present

## 2018-11-24 DIAGNOSIS — I10 Essential (primary) hypertension: Secondary | ICD-10-CM | POA: Diagnosis not present

## 2018-11-24 DIAGNOSIS — F419 Anxiety disorder, unspecified: Secondary | ICD-10-CM | POA: Diagnosis not present

## 2018-11-25 DIAGNOSIS — J3081 Allergic rhinitis due to animal (cat) (dog) hair and dander: Secondary | ICD-10-CM | POA: Diagnosis not present

## 2018-11-25 DIAGNOSIS — J3089 Other allergic rhinitis: Secondary | ICD-10-CM | POA: Diagnosis not present

## 2018-11-25 DIAGNOSIS — J301 Allergic rhinitis due to pollen: Secondary | ICD-10-CM | POA: Diagnosis not present

## 2018-12-03 DIAGNOSIS — J3081 Allergic rhinitis due to animal (cat) (dog) hair and dander: Secondary | ICD-10-CM | POA: Diagnosis not present

## 2018-12-03 DIAGNOSIS — J3089 Other allergic rhinitis: Secondary | ICD-10-CM | POA: Diagnosis not present

## 2018-12-03 DIAGNOSIS — J301 Allergic rhinitis due to pollen: Secondary | ICD-10-CM | POA: Diagnosis not present

## 2018-12-09 DIAGNOSIS — J3089 Other allergic rhinitis: Secondary | ICD-10-CM | POA: Diagnosis not present

## 2018-12-09 DIAGNOSIS — J301 Allergic rhinitis due to pollen: Secondary | ICD-10-CM | POA: Diagnosis not present

## 2018-12-09 DIAGNOSIS — J3081 Allergic rhinitis due to animal (cat) (dog) hair and dander: Secondary | ICD-10-CM | POA: Diagnosis not present

## 2018-12-16 DIAGNOSIS — J3081 Allergic rhinitis due to animal (cat) (dog) hair and dander: Secondary | ICD-10-CM | POA: Diagnosis not present

## 2018-12-16 DIAGNOSIS — J3089 Other allergic rhinitis: Secondary | ICD-10-CM | POA: Diagnosis not present

## 2018-12-16 DIAGNOSIS — J301 Allergic rhinitis due to pollen: Secondary | ICD-10-CM | POA: Diagnosis not present

## 2018-12-23 DIAGNOSIS — J3081 Allergic rhinitis due to animal (cat) (dog) hair and dander: Secondary | ICD-10-CM | POA: Diagnosis not present

## 2018-12-23 DIAGNOSIS — J301 Allergic rhinitis due to pollen: Secondary | ICD-10-CM | POA: Diagnosis not present

## 2018-12-23 DIAGNOSIS — J3089 Other allergic rhinitis: Secondary | ICD-10-CM | POA: Diagnosis not present

## 2018-12-30 DIAGNOSIS — J3089 Other allergic rhinitis: Secondary | ICD-10-CM | POA: Diagnosis not present

## 2018-12-30 DIAGNOSIS — J3081 Allergic rhinitis due to animal (cat) (dog) hair and dander: Secondary | ICD-10-CM | POA: Diagnosis not present

## 2018-12-30 DIAGNOSIS — J301 Allergic rhinitis due to pollen: Secondary | ICD-10-CM | POA: Diagnosis not present

## 2019-01-02 DIAGNOSIS — Z6830 Body mass index (BMI) 30.0-30.9, adult: Secondary | ICD-10-CM | POA: Diagnosis not present

## 2019-01-06 DIAGNOSIS — J301 Allergic rhinitis due to pollen: Secondary | ICD-10-CM | POA: Diagnosis not present

## 2019-01-06 DIAGNOSIS — J3081 Allergic rhinitis due to animal (cat) (dog) hair and dander: Secondary | ICD-10-CM | POA: Diagnosis not present

## 2019-01-06 DIAGNOSIS — J3089 Other allergic rhinitis: Secondary | ICD-10-CM | POA: Diagnosis not present

## 2019-01-12 DIAGNOSIS — J3081 Allergic rhinitis due to animal (cat) (dog) hair and dander: Secondary | ICD-10-CM | POA: Diagnosis not present

## 2019-01-12 DIAGNOSIS — J3089 Other allergic rhinitis: Secondary | ICD-10-CM | POA: Diagnosis not present

## 2019-01-12 DIAGNOSIS — J301 Allergic rhinitis due to pollen: Secondary | ICD-10-CM | POA: Diagnosis not present

## 2019-01-20 DIAGNOSIS — J3081 Allergic rhinitis due to animal (cat) (dog) hair and dander: Secondary | ICD-10-CM | POA: Diagnosis not present

## 2019-01-20 DIAGNOSIS — J301 Allergic rhinitis due to pollen: Secondary | ICD-10-CM | POA: Diagnosis not present

## 2019-01-20 DIAGNOSIS — J3089 Other allergic rhinitis: Secondary | ICD-10-CM | POA: Diagnosis not present

## 2019-01-27 DIAGNOSIS — J3081 Allergic rhinitis due to animal (cat) (dog) hair and dander: Secondary | ICD-10-CM | POA: Diagnosis not present

## 2019-01-27 DIAGNOSIS — J3089 Other allergic rhinitis: Secondary | ICD-10-CM | POA: Diagnosis not present

## 2019-01-27 DIAGNOSIS — J301 Allergic rhinitis due to pollen: Secondary | ICD-10-CM | POA: Diagnosis not present

## 2019-02-03 DIAGNOSIS — J301 Allergic rhinitis due to pollen: Secondary | ICD-10-CM | POA: Diagnosis not present

## 2019-02-03 DIAGNOSIS — J3089 Other allergic rhinitis: Secondary | ICD-10-CM | POA: Diagnosis not present

## 2019-02-03 DIAGNOSIS — J3081 Allergic rhinitis due to animal (cat) (dog) hair and dander: Secondary | ICD-10-CM | POA: Diagnosis not present

## 2019-02-11 DIAGNOSIS — J3089 Other allergic rhinitis: Secondary | ICD-10-CM | POA: Diagnosis not present

## 2019-02-11 DIAGNOSIS — J3081 Allergic rhinitis due to animal (cat) (dog) hair and dander: Secondary | ICD-10-CM | POA: Diagnosis not present

## 2019-02-11 DIAGNOSIS — J301 Allergic rhinitis due to pollen: Secondary | ICD-10-CM | POA: Diagnosis not present

## 2019-02-17 DIAGNOSIS — J301 Allergic rhinitis due to pollen: Secondary | ICD-10-CM | POA: Diagnosis not present

## 2019-02-17 DIAGNOSIS — J3081 Allergic rhinitis due to animal (cat) (dog) hair and dander: Secondary | ICD-10-CM | POA: Diagnosis not present

## 2019-02-17 DIAGNOSIS — J3089 Other allergic rhinitis: Secondary | ICD-10-CM | POA: Diagnosis not present

## 2019-02-24 DIAGNOSIS — J3089 Other allergic rhinitis: Secondary | ICD-10-CM | POA: Diagnosis not present

## 2019-02-24 DIAGNOSIS — J3081 Allergic rhinitis due to animal (cat) (dog) hair and dander: Secondary | ICD-10-CM | POA: Diagnosis not present

## 2019-02-24 DIAGNOSIS — J301 Allergic rhinitis due to pollen: Secondary | ICD-10-CM | POA: Diagnosis not present

## 2019-03-03 DIAGNOSIS — J3081 Allergic rhinitis due to animal (cat) (dog) hair and dander: Secondary | ICD-10-CM | POA: Diagnosis not present

## 2019-03-03 DIAGNOSIS — J301 Allergic rhinitis due to pollen: Secondary | ICD-10-CM | POA: Diagnosis not present

## 2019-03-03 DIAGNOSIS — J3089 Other allergic rhinitis: Secondary | ICD-10-CM | POA: Diagnosis not present

## 2019-03-10 DIAGNOSIS — J301 Allergic rhinitis due to pollen: Secondary | ICD-10-CM | POA: Diagnosis not present

## 2019-03-10 DIAGNOSIS — J3081 Allergic rhinitis due to animal (cat) (dog) hair and dander: Secondary | ICD-10-CM | POA: Diagnosis not present

## 2019-03-10 DIAGNOSIS — J3089 Other allergic rhinitis: Secondary | ICD-10-CM | POA: Diagnosis not present

## 2019-03-11 DIAGNOSIS — F419 Anxiety disorder, unspecified: Secondary | ICD-10-CM | POA: Diagnosis not present

## 2019-03-11 DIAGNOSIS — Z125 Encounter for screening for malignant neoplasm of prostate: Secondary | ICD-10-CM | POA: Diagnosis not present

## 2019-03-11 DIAGNOSIS — I10 Essential (primary) hypertension: Secondary | ICD-10-CM | POA: Diagnosis not present

## 2019-03-11 DIAGNOSIS — E785 Hyperlipidemia, unspecified: Secondary | ICD-10-CM | POA: Diagnosis not present

## 2019-03-11 DIAGNOSIS — Z79899 Other long term (current) drug therapy: Secondary | ICD-10-CM | POA: Diagnosis not present

## 2019-03-17 DIAGNOSIS — J3089 Other allergic rhinitis: Secondary | ICD-10-CM | POA: Diagnosis not present

## 2019-03-17 DIAGNOSIS — J3081 Allergic rhinitis due to animal (cat) (dog) hair and dander: Secondary | ICD-10-CM | POA: Diagnosis not present

## 2019-03-17 DIAGNOSIS — J301 Allergic rhinitis due to pollen: Secondary | ICD-10-CM | POA: Diagnosis not present

## 2019-03-18 DIAGNOSIS — I10 Essential (primary) hypertension: Secondary | ICD-10-CM | POA: Diagnosis not present

## 2019-03-18 DIAGNOSIS — E785 Hyperlipidemia, unspecified: Secondary | ICD-10-CM | POA: Diagnosis not present

## 2019-03-25 DIAGNOSIS — R05 Cough: Secondary | ICD-10-CM | POA: Diagnosis not present

## 2019-03-25 DIAGNOSIS — J3089 Other allergic rhinitis: Secondary | ICD-10-CM | POA: Diagnosis not present

## 2019-03-25 DIAGNOSIS — J3081 Allergic rhinitis due to animal (cat) (dog) hair and dander: Secondary | ICD-10-CM | POA: Diagnosis not present

## 2019-03-25 DIAGNOSIS — J301 Allergic rhinitis due to pollen: Secondary | ICD-10-CM | POA: Diagnosis not present

## 2019-03-31 DIAGNOSIS — J301 Allergic rhinitis due to pollen: Secondary | ICD-10-CM | POA: Diagnosis not present

## 2019-03-31 DIAGNOSIS — J3089 Other allergic rhinitis: Secondary | ICD-10-CM | POA: Diagnosis not present

## 2019-03-31 DIAGNOSIS — J3081 Allergic rhinitis due to animal (cat) (dog) hair and dander: Secondary | ICD-10-CM | POA: Diagnosis not present

## 2019-04-07 DIAGNOSIS — J3089 Other allergic rhinitis: Secondary | ICD-10-CM | POA: Diagnosis not present

## 2019-04-07 DIAGNOSIS — J3081 Allergic rhinitis due to animal (cat) (dog) hair and dander: Secondary | ICD-10-CM | POA: Diagnosis not present

## 2019-04-07 DIAGNOSIS — J301 Allergic rhinitis due to pollen: Secondary | ICD-10-CM | POA: Diagnosis not present

## 2019-04-14 DIAGNOSIS — J301 Allergic rhinitis due to pollen: Secondary | ICD-10-CM | POA: Diagnosis not present

## 2019-04-14 DIAGNOSIS — J3081 Allergic rhinitis due to animal (cat) (dog) hair and dander: Secondary | ICD-10-CM | POA: Diagnosis not present

## 2019-04-14 DIAGNOSIS — J3089 Other allergic rhinitis: Secondary | ICD-10-CM | POA: Diagnosis not present

## 2019-04-21 DIAGNOSIS — J3089 Other allergic rhinitis: Secondary | ICD-10-CM | POA: Diagnosis not present

## 2019-04-21 DIAGNOSIS — J301 Allergic rhinitis due to pollen: Secondary | ICD-10-CM | POA: Diagnosis not present

## 2019-04-21 DIAGNOSIS — J3081 Allergic rhinitis due to animal (cat) (dog) hair and dander: Secondary | ICD-10-CM | POA: Diagnosis not present

## 2019-04-29 DIAGNOSIS — J3081 Allergic rhinitis due to animal (cat) (dog) hair and dander: Secondary | ICD-10-CM | POA: Diagnosis not present

## 2019-04-29 DIAGNOSIS — J301 Allergic rhinitis due to pollen: Secondary | ICD-10-CM | POA: Diagnosis not present

## 2019-04-29 DIAGNOSIS — J3089 Other allergic rhinitis: Secondary | ICD-10-CM | POA: Diagnosis not present

## 2019-05-04 DIAGNOSIS — J3089 Other allergic rhinitis: Secondary | ICD-10-CM | POA: Diagnosis not present

## 2019-05-04 DIAGNOSIS — J301 Allergic rhinitis due to pollen: Secondary | ICD-10-CM | POA: Diagnosis not present

## 2019-05-04 DIAGNOSIS — J3081 Allergic rhinitis due to animal (cat) (dog) hair and dander: Secondary | ICD-10-CM | POA: Diagnosis not present

## 2019-05-11 DIAGNOSIS — J3081 Allergic rhinitis due to animal (cat) (dog) hair and dander: Secondary | ICD-10-CM | POA: Diagnosis not present

## 2019-05-11 DIAGNOSIS — J301 Allergic rhinitis due to pollen: Secondary | ICD-10-CM | POA: Diagnosis not present

## 2019-05-11 DIAGNOSIS — J3089 Other allergic rhinitis: Secondary | ICD-10-CM | POA: Diagnosis not present

## 2019-05-18 DIAGNOSIS — J3081 Allergic rhinitis due to animal (cat) (dog) hair and dander: Secondary | ICD-10-CM | POA: Diagnosis not present

## 2019-05-18 DIAGNOSIS — J301 Allergic rhinitis due to pollen: Secondary | ICD-10-CM | POA: Diagnosis not present

## 2019-05-18 DIAGNOSIS — J3089 Other allergic rhinitis: Secondary | ICD-10-CM | POA: Diagnosis not present

## 2019-05-25 DIAGNOSIS — J3089 Other allergic rhinitis: Secondary | ICD-10-CM | POA: Diagnosis not present

## 2019-05-25 DIAGNOSIS — J3081 Allergic rhinitis due to animal (cat) (dog) hair and dander: Secondary | ICD-10-CM | POA: Diagnosis not present

## 2019-05-25 DIAGNOSIS — J301 Allergic rhinitis due to pollen: Secondary | ICD-10-CM | POA: Diagnosis not present

## 2019-06-01 DIAGNOSIS — J301 Allergic rhinitis due to pollen: Secondary | ICD-10-CM | POA: Diagnosis not present

## 2019-06-01 DIAGNOSIS — J3081 Allergic rhinitis due to animal (cat) (dog) hair and dander: Secondary | ICD-10-CM | POA: Diagnosis not present

## 2019-06-01 DIAGNOSIS — J3089 Other allergic rhinitis: Secondary | ICD-10-CM | POA: Diagnosis not present

## 2019-06-08 DIAGNOSIS — J301 Allergic rhinitis due to pollen: Secondary | ICD-10-CM | POA: Diagnosis not present

## 2019-06-08 DIAGNOSIS — J3089 Other allergic rhinitis: Secondary | ICD-10-CM | POA: Diagnosis not present

## 2019-06-08 DIAGNOSIS — J3081 Allergic rhinitis due to animal (cat) (dog) hair and dander: Secondary | ICD-10-CM | POA: Diagnosis not present

## 2019-06-16 DIAGNOSIS — J301 Allergic rhinitis due to pollen: Secondary | ICD-10-CM | POA: Diagnosis not present

## 2019-06-16 DIAGNOSIS — J3089 Other allergic rhinitis: Secondary | ICD-10-CM | POA: Diagnosis not present

## 2019-06-16 DIAGNOSIS — J3081 Allergic rhinitis due to animal (cat) (dog) hair and dander: Secondary | ICD-10-CM | POA: Diagnosis not present

## 2019-06-23 DIAGNOSIS — J301 Allergic rhinitis due to pollen: Secondary | ICD-10-CM | POA: Diagnosis not present

## 2019-06-23 DIAGNOSIS — J3081 Allergic rhinitis due to animal (cat) (dog) hair and dander: Secondary | ICD-10-CM | POA: Diagnosis not present

## 2019-06-23 DIAGNOSIS — J3089 Other allergic rhinitis: Secondary | ICD-10-CM | POA: Diagnosis not present

## 2019-07-06 DIAGNOSIS — J301 Allergic rhinitis due to pollen: Secondary | ICD-10-CM | POA: Diagnosis not present

## 2019-07-06 DIAGNOSIS — J3081 Allergic rhinitis due to animal (cat) (dog) hair and dander: Secondary | ICD-10-CM | POA: Diagnosis not present

## 2019-07-06 DIAGNOSIS — J3089 Other allergic rhinitis: Secondary | ICD-10-CM | POA: Diagnosis not present

## 2019-07-07 DIAGNOSIS — J3089 Other allergic rhinitis: Secondary | ICD-10-CM | POA: Diagnosis not present

## 2019-07-07 DIAGNOSIS — J3081 Allergic rhinitis due to animal (cat) (dog) hair and dander: Secondary | ICD-10-CM | POA: Diagnosis not present

## 2019-07-14 DIAGNOSIS — J3089 Other allergic rhinitis: Secondary | ICD-10-CM | POA: Diagnosis not present

## 2019-07-14 DIAGNOSIS — J3081 Allergic rhinitis due to animal (cat) (dog) hair and dander: Secondary | ICD-10-CM | POA: Diagnosis not present

## 2019-07-14 DIAGNOSIS — J301 Allergic rhinitis due to pollen: Secondary | ICD-10-CM | POA: Diagnosis not present

## 2019-07-20 DIAGNOSIS — J301 Allergic rhinitis due to pollen: Secondary | ICD-10-CM | POA: Diagnosis not present

## 2019-07-20 DIAGNOSIS — J3089 Other allergic rhinitis: Secondary | ICD-10-CM | POA: Diagnosis not present

## 2019-07-20 DIAGNOSIS — J3081 Allergic rhinitis due to animal (cat) (dog) hair and dander: Secondary | ICD-10-CM | POA: Diagnosis not present

## 2019-07-22 DIAGNOSIS — I10 Essential (primary) hypertension: Secondary | ICD-10-CM | POA: Diagnosis not present

## 2019-07-22 DIAGNOSIS — F419 Anxiety disorder, unspecified: Secondary | ICD-10-CM | POA: Diagnosis not present

## 2019-07-28 DIAGNOSIS — J3081 Allergic rhinitis due to animal (cat) (dog) hair and dander: Secondary | ICD-10-CM | POA: Diagnosis not present

## 2019-07-28 DIAGNOSIS — J301 Allergic rhinitis due to pollen: Secondary | ICD-10-CM | POA: Diagnosis not present

## 2019-07-28 DIAGNOSIS — J3089 Other allergic rhinitis: Secondary | ICD-10-CM | POA: Diagnosis not present

## 2019-08-04 DIAGNOSIS — J3081 Allergic rhinitis due to animal (cat) (dog) hair and dander: Secondary | ICD-10-CM | POA: Diagnosis not present

## 2019-08-04 DIAGNOSIS — J301 Allergic rhinitis due to pollen: Secondary | ICD-10-CM | POA: Diagnosis not present

## 2019-08-04 DIAGNOSIS — J3089 Other allergic rhinitis: Secondary | ICD-10-CM | POA: Diagnosis not present

## 2019-09-20 ENCOUNTER — Other Ambulatory Visit: Payer: Self-pay

## 2019-09-20 ENCOUNTER — Ambulatory Visit
Admission: EM | Admit: 2019-09-20 | Discharge: 2019-09-20 | Disposition: A | Discharge status: Home / Self Care | Payer: BC Managed Care – PPO | Attending: Emergency Medicine | Admitting: Emergency Medicine

## 2019-09-20 DIAGNOSIS — R6889 Other general symptoms and signs: Secondary | ICD-10-CM | POA: Diagnosis not present

## 2019-09-20 DIAGNOSIS — Z20822 Contact with and (suspected) exposure to covid-19: Secondary | ICD-10-CM | POA: Diagnosis not present

## 2019-09-20 DIAGNOSIS — J069 Acute upper respiratory infection, unspecified: Secondary | ICD-10-CM

## 2019-09-20 HISTORY — DX: Essential (primary) hypertension: I10

## 2019-09-20 MED ORDER — PREDNISONE 20 MG PO TABS: 20.0000 mg | ORAL_TABLET | Freq: Two times a day (BID) | ORAL | 0 refills | Status: AC

## 2019-09-20 NOTE — Discharge Instructions (Addendum)
COVID testing ordered.  It will take between 2-5 days for test results.  Someone will contact you regarding abnormal results.    In the meantime: You should remain isolated in your home for 10 days from symptom onset AND greater than 72 hours after symptoms resolution (absence of fever without the use of fever-reducing medication and improvement in respiratory symptoms), whichever is longer Get plenty of rest and push fluids Prednisone prescribed. Take as directed and to completion Use OTC medications like ibuprofen or tylenol as needed fever or pain Call or go to the ED if you have any new or worsening symptoms such as fever, cough, shortness of breath, chest tightness, chest pain, turning blue, changes in mental status, etc..Marland Kitchen

## 2019-09-20 NOTE — ED Provider Notes (Signed)
[facility]   [id number] 09/20/19 Arrival Time: 7741   CC: COVID symptoms  SUBJECTIVE: History from: patient.  Logan Solis is a 54 y.o. male who presents with sinus congestion, sore throat, and low grade fever of 99 x 2 days.  Denies sick exposure to COVID, flu or strep.  Denies recent travel.  Has tried xyzal and ibuprofen/ tylenol without relief.  Denies aggravating factors.  Denies previous COVID infection.   Reports previous symptoms in the past with sinus infection.  Denies chills, fatigue, SOB, wheezing, chest pain, nausea, changes in bowel or bladder habits.    ROS: As per HPI.  All other pertinent ROS negative.     Past Medical History:  Diagnosis Date  . Hypertension    Past Surgical History:  Procedure Laterality Date  . BACK SURGERY    . SHOULDER SURGERY     No Known Allergies No current facility-administered medications on file prior to encounter.   Current Outpatient Medications on File Prior to Encounter  Medication Sig Dispense Refill  . amLODipine (NORVASC) 10 MG tablet Take 10 mg by mouth daily.    . Azelastine-Fluticasone (DYMISTA NA) Place into the nose.    . citalopram (CELEXA) 20 MG tablet Take 20 mg by mouth daily.    Marland Kitchen levocetirizine (XYZAL) 5 MG tablet Take 5 mg by mouth every evening.    Marland Kitchen losartan-hydrochlorothiazide (HYZAAR) 100-25 MG tablet Take 1 tablet by mouth daily.     Social History   Socioeconomic History  . Marital status: Married    Spouse name: Not on file  . Number of children: Not on file  . Years of education: Not on file  . Highest education level: Not on file  Occupational History  . Not on file  Tobacco Use  . Smoking status: Never Smoker  . Smokeless tobacco: Never Used  Substance and Sexual Activity  . Alcohol use: Never  . Drug use: Never  . Sexual activity: Not on file  Other Topics Concern  . Not on file  Social History Narrative  . Not on file   Social Determinants of Health   Financial  Resource Strain:   . Difficulty of Paying Living Expenses: Not on file  Food Insecurity:   . Worried About Programme researcher, broadcasting/film/video in the Last Year: Not on file  . Ran Out of Food in the Last Year: Not on file  Transportation Needs:   . Lack of Transportation (Medical): Not on file  . Lack of Transportation (Non-Medical): Not on file  Physical Activity:   . Days of Exercise per Week: Not on file  . Minutes of Exercise per Session: Not on file  Stress:   . Feeling of Stress : Not on file  Social Connections:   . Frequency of Communication with Friends and Family: Not on file  . Frequency of Social Gatherings with Friends and Family: Not on file  . Attends Religious Services: Not on file  . Active Member of Clubs or Organizations: Not on file  . Attends Banker Meetings: Not on file  . Marital Status: Not on file  Intimate Partner Violence:   . Fear of Current or Ex-Partner: Not on file  . Emotionally Abused: Not on file  . Physically Abused: Not on file  . Sexually Abused: Not on file   Family History  Problem Relation Age of Onset  . Healthy Mother   . Healthy Father     OBJECTIVE:  Vitals:  09/20/19 0846  BP: 127/82  Pulse: 82  Resp: 17  Temp: 98.1 F (36.7 C)  TempSrc: Oral  SpO2: 97%     General appearance: alert; appears mildly fatigued, but nontoxic; speaking in full sentences and tolerating own secretions HEENT: NCAT; Ears: EACs clear, TMs pearly gray; Eyes: PERRL.  EOM grossly intact. Nose: nares patent without rhinorrhea, Throat: oropharynx clear, tonsils non erythematous or enlarged, uvula midline  Neck: supple without LAD Lungs: unlabored respirations, symmetrical air entry; cough: absent; no respiratory distress; CTAB Heart: regular rate and rhythm.   Skin: warm and dry Psychological: alert and cooperative; normal mood and affect  ASSESSMENT & PLAN:  1. Suspected COVID-19 virus infection   2. Viral URI     Meds ordered this encounter   Medications  . predniSONE (DELTASONE) 20 MG tablet    Sig: Take 1 tablet (20 mg total) by mouth 2 (two) times daily with a meal for 5 days.    Dispense:  10 tablet    Refill:  0    Order Specific Question:   Supervising Provider    Answer:   Raylene Everts [[id number]]    COVID testing ordered.  It will take between 2-5 days for test results.  Someone will contact you regarding abnormal results.    In the meantime: You should remain isolated in your home for 10 days from symptom onset AND greater than 72 hours after symptoms resolution (absence of fever without the use of fever-reducing medication and improvement in respiratory symptoms), whichever is longer Get plenty of rest and push fluids Prednisone prescribed. Take as directed and to completion Use OTC medications like ibuprofen or tylenol as needed fever or pain Call or go to the ED if you have any new or worsening symptoms such as fever, cough, shortness of breath, chest tightness, chest pain, turning blue, changes in mental status, etc...    Reviewed expectations re: course of current medical issues. Questions answered. Outlined signs and symptoms indicating need for more acute intervention. Patient verbalized understanding. After Visit Summary given.         Lestine Box, PA-C 09/20/19 1311

## 2019-09-20 NOTE — ED Triage Notes (Signed)
Pt presents with complaints of head and nasal congestion, and sore throat x 3 days. Reports concern for sinus infection.

## 2019-09-21 LAB — NOVEL CORONAVIRUS, NAA: SARS-CoV-2, NAA: NOT DETECTED

## 2019-11-09 DIAGNOSIS — J3081 Allergic rhinitis due to animal (cat) (dog) hair and dander: Secondary | ICD-10-CM | POA: Diagnosis not present

## 2019-11-09 DIAGNOSIS — J301 Allergic rhinitis due to pollen: Secondary | ICD-10-CM | POA: Diagnosis not present

## 2019-11-09 DIAGNOSIS — J3089 Other allergic rhinitis: Secondary | ICD-10-CM | POA: Diagnosis not present

## 2019-11-16 DIAGNOSIS — J3081 Allergic rhinitis due to animal (cat) (dog) hair and dander: Secondary | ICD-10-CM | POA: Diagnosis not present

## 2019-11-16 DIAGNOSIS — J3089 Other allergic rhinitis: Secondary | ICD-10-CM | POA: Diagnosis not present

## 2019-11-16 DIAGNOSIS — J301 Allergic rhinitis due to pollen: Secondary | ICD-10-CM | POA: Diagnosis not present

## 2019-11-23 DIAGNOSIS — F419 Anxiety disorder, unspecified: Secondary | ICD-10-CM | POA: Diagnosis not present

## 2019-11-23 DIAGNOSIS — Z6830 Body mass index (BMI) 30.0-30.9, adult: Secondary | ICD-10-CM | POA: Diagnosis not present

## 2019-11-23 DIAGNOSIS — I10 Essential (primary) hypertension: Secondary | ICD-10-CM | POA: Diagnosis not present

## 2019-11-24 DIAGNOSIS — J3089 Other allergic rhinitis: Secondary | ICD-10-CM | POA: Diagnosis not present

## 2019-11-24 DIAGNOSIS — J3081 Allergic rhinitis due to animal (cat) (dog) hair and dander: Secondary | ICD-10-CM | POA: Diagnosis not present

## 2019-11-24 DIAGNOSIS — J301 Allergic rhinitis due to pollen: Secondary | ICD-10-CM | POA: Diagnosis not present

## 2019-11-30 DIAGNOSIS — L820 Inflamed seborrheic keratosis: Secondary | ICD-10-CM | POA: Diagnosis not present

## 2019-11-30 DIAGNOSIS — L578 Other skin changes due to chronic exposure to nonionizing radiation: Secondary | ICD-10-CM | POA: Diagnosis not present

## 2019-11-30 DIAGNOSIS — L821 Other seborrheic keratosis: Secondary | ICD-10-CM | POA: Diagnosis not present

## 2019-11-30 DIAGNOSIS — L570 Actinic keratosis: Secondary | ICD-10-CM | POA: Diagnosis not present

## 2019-11-30 DIAGNOSIS — D225 Melanocytic nevi of trunk: Secondary | ICD-10-CM | POA: Diagnosis not present

## 2019-11-30 DIAGNOSIS — L814 Other melanin hyperpigmentation: Secondary | ICD-10-CM | POA: Diagnosis not present

## 2019-12-02 DIAGNOSIS — J3081 Allergic rhinitis due to animal (cat) (dog) hair and dander: Secondary | ICD-10-CM | POA: Diagnosis not present

## 2019-12-02 DIAGNOSIS — J3089 Other allergic rhinitis: Secondary | ICD-10-CM | POA: Diagnosis not present

## 2019-12-02 DIAGNOSIS — J301 Allergic rhinitis due to pollen: Secondary | ICD-10-CM | POA: Diagnosis not present

## 2019-12-09 DIAGNOSIS — J3081 Allergic rhinitis due to animal (cat) (dog) hair and dander: Secondary | ICD-10-CM | POA: Diagnosis not present

## 2019-12-09 DIAGNOSIS — J301 Allergic rhinitis due to pollen: Secondary | ICD-10-CM | POA: Diagnosis not present

## 2019-12-09 DIAGNOSIS — J3089 Other allergic rhinitis: Secondary | ICD-10-CM | POA: Diagnosis not present

## 2019-12-15 DIAGNOSIS — M5416 Radiculopathy, lumbar region: Secondary | ICD-10-CM | POA: Diagnosis not present

## 2019-12-15 DIAGNOSIS — M47816 Spondylosis without myelopathy or radiculopathy, lumbar region: Secondary | ICD-10-CM | POA: Diagnosis not present

## 2019-12-15 DIAGNOSIS — M5126 Other intervertebral disc displacement, lumbar region: Secondary | ICD-10-CM | POA: Diagnosis not present

## 2019-12-15 DIAGNOSIS — M9973 Connective tissue and disc stenosis of intervertebral foramina of lumbar region: Secondary | ICD-10-CM | POA: Diagnosis not present

## 2019-12-16 DIAGNOSIS — J3081 Allergic rhinitis due to animal (cat) (dog) hair and dander: Secondary | ICD-10-CM | POA: Diagnosis not present

## 2019-12-16 DIAGNOSIS — J301 Allergic rhinitis due to pollen: Secondary | ICD-10-CM | POA: Diagnosis not present

## 2019-12-16 DIAGNOSIS — J3089 Other allergic rhinitis: Secondary | ICD-10-CM | POA: Diagnosis not present

## 2019-12-22 DIAGNOSIS — J3081 Allergic rhinitis due to animal (cat) (dog) hair and dander: Secondary | ICD-10-CM | POA: Diagnosis not present

## 2019-12-22 DIAGNOSIS — J3089 Other allergic rhinitis: Secondary | ICD-10-CM | POA: Diagnosis not present

## 2019-12-22 DIAGNOSIS — J301 Allergic rhinitis due to pollen: Secondary | ICD-10-CM | POA: Diagnosis not present

## 2019-12-23 DIAGNOSIS — M5416 Radiculopathy, lumbar region: Secondary | ICD-10-CM | POA: Diagnosis not present

## 2019-12-30 DIAGNOSIS — J3089 Other allergic rhinitis: Secondary | ICD-10-CM | POA: Diagnosis not present

## 2019-12-30 DIAGNOSIS — J3081 Allergic rhinitis due to animal (cat) (dog) hair and dander: Secondary | ICD-10-CM | POA: Diagnosis not present

## 2019-12-30 DIAGNOSIS — J301 Allergic rhinitis due to pollen: Secondary | ICD-10-CM | POA: Diagnosis not present

## 2019-12-31 DIAGNOSIS — J301 Allergic rhinitis due to pollen: Secondary | ICD-10-CM | POA: Diagnosis not present

## 2020-01-01 DIAGNOSIS — J3081 Allergic rhinitis due to animal (cat) (dog) hair and dander: Secondary | ICD-10-CM | POA: Diagnosis not present

## 2020-01-01 DIAGNOSIS — J3089 Other allergic rhinitis: Secondary | ICD-10-CM | POA: Diagnosis not present

## 2020-01-08 DIAGNOSIS — J3089 Other allergic rhinitis: Secondary | ICD-10-CM | POA: Diagnosis not present

## 2020-01-08 DIAGNOSIS — J301 Allergic rhinitis due to pollen: Secondary | ICD-10-CM | POA: Diagnosis not present

## 2020-01-08 DIAGNOSIS — J3081 Allergic rhinitis due to animal (cat) (dog) hair and dander: Secondary | ICD-10-CM | POA: Diagnosis not present

## 2020-01-18 DIAGNOSIS — J3081 Allergic rhinitis due to animal (cat) (dog) hair and dander: Secondary | ICD-10-CM | POA: Diagnosis not present

## 2020-01-18 DIAGNOSIS — J3089 Other allergic rhinitis: Secondary | ICD-10-CM | POA: Diagnosis not present

## 2020-01-18 DIAGNOSIS — J301 Allergic rhinitis due to pollen: Secondary | ICD-10-CM | POA: Diagnosis not present

## 2020-01-26 DIAGNOSIS — J3081 Allergic rhinitis due to animal (cat) (dog) hair and dander: Secondary | ICD-10-CM | POA: Diagnosis not present

## 2020-01-26 DIAGNOSIS — J301 Allergic rhinitis due to pollen: Secondary | ICD-10-CM | POA: Diagnosis not present

## 2020-01-26 DIAGNOSIS — J3089 Other allergic rhinitis: Secondary | ICD-10-CM | POA: Diagnosis not present

## 2020-02-02 DIAGNOSIS — J301 Allergic rhinitis due to pollen: Secondary | ICD-10-CM | POA: Diagnosis not present

## 2020-02-02 DIAGNOSIS — J3081 Allergic rhinitis due to animal (cat) (dog) hair and dander: Secondary | ICD-10-CM | POA: Diagnosis not present

## 2020-02-02 DIAGNOSIS — J3089 Other allergic rhinitis: Secondary | ICD-10-CM | POA: Diagnosis not present

## 2020-02-08 DIAGNOSIS — J3081 Allergic rhinitis due to animal (cat) (dog) hair and dander: Secondary | ICD-10-CM | POA: Diagnosis not present

## 2020-02-08 DIAGNOSIS — J3089 Other allergic rhinitis: Secondary | ICD-10-CM | POA: Diagnosis not present

## 2020-02-08 DIAGNOSIS — J301 Allergic rhinitis due to pollen: Secondary | ICD-10-CM | POA: Diagnosis not present

## 2020-02-16 DIAGNOSIS — J3089 Other allergic rhinitis: Secondary | ICD-10-CM | POA: Diagnosis not present

## 2020-02-16 DIAGNOSIS — J3081 Allergic rhinitis due to animal (cat) (dog) hair and dander: Secondary | ICD-10-CM | POA: Diagnosis not present

## 2020-02-16 DIAGNOSIS — J301 Allergic rhinitis due to pollen: Secondary | ICD-10-CM | POA: Diagnosis not present

## 2020-02-22 DIAGNOSIS — J3081 Allergic rhinitis due to animal (cat) (dog) hair and dander: Secondary | ICD-10-CM | POA: Diagnosis not present

## 2020-02-22 DIAGNOSIS — J301 Allergic rhinitis due to pollen: Secondary | ICD-10-CM | POA: Diagnosis not present

## 2020-02-22 DIAGNOSIS — J3089 Other allergic rhinitis: Secondary | ICD-10-CM | POA: Diagnosis not present

## 2020-02-23 DIAGNOSIS — M545 Low back pain: Secondary | ICD-10-CM | POA: Diagnosis not present

## 2020-02-23 DIAGNOSIS — M47816 Spondylosis without myelopathy or radiculopathy, lumbar region: Secondary | ICD-10-CM | POA: Diagnosis not present

## 2020-02-29 DIAGNOSIS — J3089 Other allergic rhinitis: Secondary | ICD-10-CM | POA: Diagnosis not present

## 2020-02-29 DIAGNOSIS — J3081 Allergic rhinitis due to animal (cat) (dog) hair and dander: Secondary | ICD-10-CM | POA: Diagnosis not present

## 2020-02-29 DIAGNOSIS — J301 Allergic rhinitis due to pollen: Secondary | ICD-10-CM | POA: Diagnosis not present

## 2020-03-01 DIAGNOSIS — M545 Low back pain: Secondary | ICD-10-CM | POA: Diagnosis not present

## 2020-03-07 DIAGNOSIS — J301 Allergic rhinitis due to pollen: Secondary | ICD-10-CM | POA: Diagnosis not present

## 2020-03-07 DIAGNOSIS — J3081 Allergic rhinitis due to animal (cat) (dog) hair and dander: Secondary | ICD-10-CM | POA: Diagnosis not present

## 2020-03-07 DIAGNOSIS — J3089 Other allergic rhinitis: Secondary | ICD-10-CM | POA: Diagnosis not present

## 2020-03-11 DIAGNOSIS — M545 Low back pain: Secondary | ICD-10-CM | POA: Diagnosis not present

## 2020-03-11 DIAGNOSIS — G8929 Other chronic pain: Secondary | ICD-10-CM | POA: Diagnosis not present

## 2020-03-14 DIAGNOSIS — J3081 Allergic rhinitis due to animal (cat) (dog) hair and dander: Secondary | ICD-10-CM | POA: Diagnosis not present

## 2020-03-14 DIAGNOSIS — J301 Allergic rhinitis due to pollen: Secondary | ICD-10-CM | POA: Diagnosis not present

## 2020-03-14 DIAGNOSIS — J3089 Other allergic rhinitis: Secondary | ICD-10-CM | POA: Diagnosis not present

## 2020-03-16 DIAGNOSIS — I10 Essential (primary) hypertension: Secondary | ICD-10-CM | POA: Diagnosis not present

## 2020-03-16 DIAGNOSIS — Z125 Encounter for screening for malignant neoplasm of prostate: Secondary | ICD-10-CM | POA: Diagnosis not present

## 2020-03-16 DIAGNOSIS — F419 Anxiety disorder, unspecified: Secondary | ICD-10-CM | POA: Diagnosis not present

## 2020-03-16 DIAGNOSIS — Z79899 Other long term (current) drug therapy: Secondary | ICD-10-CM | POA: Diagnosis not present

## 2020-03-16 DIAGNOSIS — E785 Hyperlipidemia, unspecified: Secondary | ICD-10-CM | POA: Diagnosis not present

## 2020-03-21 DIAGNOSIS — J3089 Other allergic rhinitis: Secondary | ICD-10-CM | POA: Diagnosis not present

## 2020-03-21 DIAGNOSIS — J301 Allergic rhinitis due to pollen: Secondary | ICD-10-CM | POA: Diagnosis not present

## 2020-03-21 DIAGNOSIS — J3081 Allergic rhinitis due to animal (cat) (dog) hair and dander: Secondary | ICD-10-CM | POA: Diagnosis not present

## 2020-03-25 DIAGNOSIS — I10 Essential (primary) hypertension: Secondary | ICD-10-CM | POA: Diagnosis not present

## 2020-03-25 DIAGNOSIS — E785 Hyperlipidemia, unspecified: Secondary | ICD-10-CM | POA: Diagnosis not present

## 2020-03-28 DIAGNOSIS — J3081 Allergic rhinitis due to animal (cat) (dog) hair and dander: Secondary | ICD-10-CM | POA: Diagnosis not present

## 2020-03-28 DIAGNOSIS — J301 Allergic rhinitis due to pollen: Secondary | ICD-10-CM | POA: Diagnosis not present

## 2020-03-28 DIAGNOSIS — J3089 Other allergic rhinitis: Secondary | ICD-10-CM | POA: Diagnosis not present

## 2020-03-28 DIAGNOSIS — R05 Cough: Secondary | ICD-10-CM | POA: Diagnosis not present

## 2020-04-06 DIAGNOSIS — L821 Other seborrheic keratosis: Secondary | ICD-10-CM | POA: Diagnosis not present

## 2020-04-06 DIAGNOSIS — J3081 Allergic rhinitis due to animal (cat) (dog) hair and dander: Secondary | ICD-10-CM | POA: Diagnosis not present

## 2020-04-06 DIAGNOSIS — D485 Neoplasm of uncertain behavior of skin: Secondary | ICD-10-CM | POA: Diagnosis not present

## 2020-04-06 DIAGNOSIS — J301 Allergic rhinitis due to pollen: Secondary | ICD-10-CM | POA: Diagnosis not present

## 2020-04-06 DIAGNOSIS — J3089 Other allergic rhinitis: Secondary | ICD-10-CM | POA: Diagnosis not present

## 2020-04-11 DIAGNOSIS — J301 Allergic rhinitis due to pollen: Secondary | ICD-10-CM | POA: Diagnosis not present

## 2020-04-11 DIAGNOSIS — J3089 Other allergic rhinitis: Secondary | ICD-10-CM | POA: Diagnosis not present

## 2020-04-11 DIAGNOSIS — J3081 Allergic rhinitis due to animal (cat) (dog) hair and dander: Secondary | ICD-10-CM | POA: Diagnosis not present

## 2020-04-18 DIAGNOSIS — J3089 Other allergic rhinitis: Secondary | ICD-10-CM | POA: Diagnosis not present

## 2020-04-18 DIAGNOSIS — J301 Allergic rhinitis due to pollen: Secondary | ICD-10-CM | POA: Diagnosis not present

## 2020-04-18 DIAGNOSIS — J3081 Allergic rhinitis due to animal (cat) (dog) hair and dander: Secondary | ICD-10-CM | POA: Diagnosis not present

## 2020-04-25 DIAGNOSIS — J3081 Allergic rhinitis due to animal (cat) (dog) hair and dander: Secondary | ICD-10-CM | POA: Diagnosis not present

## 2020-04-25 DIAGNOSIS — J301 Allergic rhinitis due to pollen: Secondary | ICD-10-CM | POA: Diagnosis not present

## 2020-04-25 DIAGNOSIS — J3089 Other allergic rhinitis: Secondary | ICD-10-CM | POA: Diagnosis not present

## 2020-05-03 DIAGNOSIS — Z23 Encounter for immunization: Secondary | ICD-10-CM | POA: Diagnosis not present

## 2020-05-09 DIAGNOSIS — J3089 Other allergic rhinitis: Secondary | ICD-10-CM | POA: Diagnosis not present

## 2020-05-09 DIAGNOSIS — J301 Allergic rhinitis due to pollen: Secondary | ICD-10-CM | POA: Diagnosis not present

## 2020-05-09 DIAGNOSIS — J3081 Allergic rhinitis due to animal (cat) (dog) hair and dander: Secondary | ICD-10-CM | POA: Diagnosis not present

## 2020-05-16 DIAGNOSIS — J3081 Allergic rhinitis due to animal (cat) (dog) hair and dander: Secondary | ICD-10-CM | POA: Diagnosis not present

## 2020-05-16 DIAGNOSIS — J3089 Other allergic rhinitis: Secondary | ICD-10-CM | POA: Diagnosis not present

## 2020-05-16 DIAGNOSIS — J301 Allergic rhinitis due to pollen: Secondary | ICD-10-CM | POA: Diagnosis not present

## 2020-05-23 DIAGNOSIS — J3081 Allergic rhinitis due to animal (cat) (dog) hair and dander: Secondary | ICD-10-CM | POA: Diagnosis not present

## 2020-05-23 DIAGNOSIS — J301 Allergic rhinitis due to pollen: Secondary | ICD-10-CM | POA: Diagnosis not present

## 2020-05-23 DIAGNOSIS — J3089 Other allergic rhinitis: Secondary | ICD-10-CM | POA: Diagnosis not present

## 2020-05-30 DIAGNOSIS — J3089 Other allergic rhinitis: Secondary | ICD-10-CM | POA: Diagnosis not present

## 2020-05-30 DIAGNOSIS — J3081 Allergic rhinitis due to animal (cat) (dog) hair and dander: Secondary | ICD-10-CM | POA: Diagnosis not present

## 2020-05-30 DIAGNOSIS — J301 Allergic rhinitis due to pollen: Secondary | ICD-10-CM | POA: Diagnosis not present

## 2020-06-06 DIAGNOSIS — J3081 Allergic rhinitis due to animal (cat) (dog) hair and dander: Secondary | ICD-10-CM | POA: Diagnosis not present

## 2020-06-06 DIAGNOSIS — J3089 Other allergic rhinitis: Secondary | ICD-10-CM | POA: Diagnosis not present

## 2020-06-06 DIAGNOSIS — J301 Allergic rhinitis due to pollen: Secondary | ICD-10-CM | POA: Diagnosis not present

## 2020-06-13 DIAGNOSIS — J301 Allergic rhinitis due to pollen: Secondary | ICD-10-CM | POA: Diagnosis not present

## 2020-06-13 DIAGNOSIS — J3081 Allergic rhinitis due to animal (cat) (dog) hair and dander: Secondary | ICD-10-CM | POA: Diagnosis not present

## 2020-06-13 DIAGNOSIS — J3089 Other allergic rhinitis: Secondary | ICD-10-CM | POA: Diagnosis not present

## 2020-06-27 DIAGNOSIS — J3089 Other allergic rhinitis: Secondary | ICD-10-CM | POA: Diagnosis not present

## 2020-06-27 DIAGNOSIS — J3081 Allergic rhinitis due to animal (cat) (dog) hair and dander: Secondary | ICD-10-CM | POA: Diagnosis not present

## 2020-06-27 DIAGNOSIS — J301 Allergic rhinitis due to pollen: Secondary | ICD-10-CM | POA: Diagnosis not present

## 2020-07-01 DIAGNOSIS — J3081 Allergic rhinitis due to animal (cat) (dog) hair and dander: Secondary | ICD-10-CM | POA: Diagnosis not present

## 2020-07-01 DIAGNOSIS — J3089 Other allergic rhinitis: Secondary | ICD-10-CM | POA: Diagnosis not present

## 2020-07-01 DIAGNOSIS — J301 Allergic rhinitis due to pollen: Secondary | ICD-10-CM | POA: Diagnosis not present

## 2020-07-04 DIAGNOSIS — J301 Allergic rhinitis due to pollen: Secondary | ICD-10-CM | POA: Diagnosis not present

## 2020-07-04 DIAGNOSIS — J3089 Other allergic rhinitis: Secondary | ICD-10-CM | POA: Diagnosis not present

## 2020-07-04 DIAGNOSIS — J3081 Allergic rhinitis due to animal (cat) (dog) hair and dander: Secondary | ICD-10-CM | POA: Diagnosis not present

## 2020-07-12 DIAGNOSIS — J3081 Allergic rhinitis due to animal (cat) (dog) hair and dander: Secondary | ICD-10-CM | POA: Diagnosis not present

## 2020-07-12 DIAGNOSIS — J301 Allergic rhinitis due to pollen: Secondary | ICD-10-CM | POA: Diagnosis not present

## 2020-07-12 DIAGNOSIS — J3089 Other allergic rhinitis: Secondary | ICD-10-CM | POA: Diagnosis not present

## 2020-07-18 DIAGNOSIS — J301 Allergic rhinitis due to pollen: Secondary | ICD-10-CM | POA: Diagnosis not present

## 2020-07-18 DIAGNOSIS — J3081 Allergic rhinitis due to animal (cat) (dog) hair and dander: Secondary | ICD-10-CM | POA: Diagnosis not present

## 2020-07-18 DIAGNOSIS — J3089 Other allergic rhinitis: Secondary | ICD-10-CM | POA: Diagnosis not present

## 2020-07-26 DIAGNOSIS — J3081 Allergic rhinitis due to animal (cat) (dog) hair and dander: Secondary | ICD-10-CM | POA: Diagnosis not present

## 2020-07-26 DIAGNOSIS — J3089 Other allergic rhinitis: Secondary | ICD-10-CM | POA: Diagnosis not present

## 2020-07-26 DIAGNOSIS — J301 Allergic rhinitis due to pollen: Secondary | ICD-10-CM | POA: Diagnosis not present

## 2020-07-28 DIAGNOSIS — F419 Anxiety disorder, unspecified: Secondary | ICD-10-CM | POA: Diagnosis not present

## 2020-07-28 DIAGNOSIS — I10 Essential (primary) hypertension: Secondary | ICD-10-CM | POA: Diagnosis not present

## 2020-08-02 DIAGNOSIS — J3081 Allergic rhinitis due to animal (cat) (dog) hair and dander: Secondary | ICD-10-CM | POA: Diagnosis not present

## 2020-08-02 DIAGNOSIS — J3089 Other allergic rhinitis: Secondary | ICD-10-CM | POA: Diagnosis not present

## 2020-08-02 DIAGNOSIS — J301 Allergic rhinitis due to pollen: Secondary | ICD-10-CM | POA: Diagnosis not present

## 2020-08-05 ENCOUNTER — Other Ambulatory Visit: Payer: BC Managed Care – PPO

## 2020-08-09 DIAGNOSIS — J3081 Allergic rhinitis due to animal (cat) (dog) hair and dander: Secondary | ICD-10-CM | POA: Diagnosis not present

## 2020-08-09 DIAGNOSIS — J3089 Other allergic rhinitis: Secondary | ICD-10-CM | POA: Diagnosis not present

## 2020-08-09 DIAGNOSIS — J301 Allergic rhinitis due to pollen: Secondary | ICD-10-CM | POA: Diagnosis not present

## 2020-08-17 DIAGNOSIS — J301 Allergic rhinitis due to pollen: Secondary | ICD-10-CM | POA: Diagnosis not present

## 2020-08-17 DIAGNOSIS — J3081 Allergic rhinitis due to animal (cat) (dog) hair and dander: Secondary | ICD-10-CM | POA: Diagnosis not present

## 2020-08-17 DIAGNOSIS — J3089 Other allergic rhinitis: Secondary | ICD-10-CM | POA: Diagnosis not present

## 2020-08-23 DIAGNOSIS — J3081 Allergic rhinitis due to animal (cat) (dog) hair and dander: Secondary | ICD-10-CM | POA: Diagnosis not present

## 2020-08-23 DIAGNOSIS — J301 Allergic rhinitis due to pollen: Secondary | ICD-10-CM | POA: Diagnosis not present

## 2020-08-23 DIAGNOSIS — J3089 Other allergic rhinitis: Secondary | ICD-10-CM | POA: Diagnosis not present

## 2020-08-30 DIAGNOSIS — J3089 Other allergic rhinitis: Secondary | ICD-10-CM | POA: Diagnosis not present

## 2020-08-30 DIAGNOSIS — J3081 Allergic rhinitis due to animal (cat) (dog) hair and dander: Secondary | ICD-10-CM | POA: Diagnosis not present

## 2020-08-30 DIAGNOSIS — J301 Allergic rhinitis due to pollen: Secondary | ICD-10-CM | POA: Diagnosis not present

## 2020-09-06 DIAGNOSIS — J301 Allergic rhinitis due to pollen: Secondary | ICD-10-CM | POA: Diagnosis not present

## 2020-09-06 DIAGNOSIS — J3089 Other allergic rhinitis: Secondary | ICD-10-CM | POA: Diagnosis not present

## 2020-09-06 DIAGNOSIS — J3081 Allergic rhinitis due to animal (cat) (dog) hair and dander: Secondary | ICD-10-CM | POA: Diagnosis not present

## 2020-09-13 DIAGNOSIS — J3089 Other allergic rhinitis: Secondary | ICD-10-CM | POA: Diagnosis not present

## 2020-09-13 DIAGNOSIS — J301 Allergic rhinitis due to pollen: Secondary | ICD-10-CM | POA: Diagnosis not present

## 2020-09-13 DIAGNOSIS — J3081 Allergic rhinitis due to animal (cat) (dog) hair and dander: Secondary | ICD-10-CM | POA: Diagnosis not present

## 2020-09-17 DIAGNOSIS — J301 Allergic rhinitis due to pollen: Secondary | ICD-10-CM | POA: Diagnosis not present

## 2020-09-19 DIAGNOSIS — J3081 Allergic rhinitis due to animal (cat) (dog) hair and dander: Secondary | ICD-10-CM | POA: Diagnosis not present

## 2020-09-19 DIAGNOSIS — J3089 Other allergic rhinitis: Secondary | ICD-10-CM | POA: Diagnosis not present

## 2020-09-20 DIAGNOSIS — J3081 Allergic rhinitis due to animal (cat) (dog) hair and dander: Secondary | ICD-10-CM | POA: Diagnosis not present

## 2020-09-20 DIAGNOSIS — J3089 Other allergic rhinitis: Secondary | ICD-10-CM | POA: Diagnosis not present

## 2020-09-20 DIAGNOSIS — J301 Allergic rhinitis due to pollen: Secondary | ICD-10-CM | POA: Diagnosis not present

## 2020-09-27 DIAGNOSIS — J3089 Other allergic rhinitis: Secondary | ICD-10-CM | POA: Diagnosis not present

## 2020-09-27 DIAGNOSIS — J301 Allergic rhinitis due to pollen: Secondary | ICD-10-CM | POA: Diagnosis not present

## 2020-09-27 DIAGNOSIS — J3081 Allergic rhinitis due to animal (cat) (dog) hair and dander: Secondary | ICD-10-CM | POA: Diagnosis not present

## 2020-09-28 DIAGNOSIS — L820 Inflamed seborrheic keratosis: Secondary | ICD-10-CM | POA: Diagnosis not present

## 2020-09-28 DIAGNOSIS — L570 Actinic keratosis: Secondary | ICD-10-CM | POA: Diagnosis not present

## 2020-09-28 DIAGNOSIS — L819 Disorder of pigmentation, unspecified: Secondary | ICD-10-CM | POA: Diagnosis not present

## 2020-10-05 DIAGNOSIS — J3081 Allergic rhinitis due to animal (cat) (dog) hair and dander: Secondary | ICD-10-CM | POA: Diagnosis not present

## 2020-10-05 DIAGNOSIS — J301 Allergic rhinitis due to pollen: Secondary | ICD-10-CM | POA: Diagnosis not present

## 2020-10-05 DIAGNOSIS — J3089 Other allergic rhinitis: Secondary | ICD-10-CM | POA: Diagnosis not present

## 2020-10-11 DIAGNOSIS — J301 Allergic rhinitis due to pollen: Secondary | ICD-10-CM | POA: Diagnosis not present

## 2020-10-11 DIAGNOSIS — J3089 Other allergic rhinitis: Secondary | ICD-10-CM | POA: Diagnosis not present

## 2020-10-11 DIAGNOSIS — J3081 Allergic rhinitis due to animal (cat) (dog) hair and dander: Secondary | ICD-10-CM | POA: Diagnosis not present

## 2020-10-18 DIAGNOSIS — J301 Allergic rhinitis due to pollen: Secondary | ICD-10-CM | POA: Diagnosis not present

## 2020-10-18 DIAGNOSIS — J3081 Allergic rhinitis due to animal (cat) (dog) hair and dander: Secondary | ICD-10-CM | POA: Diagnosis not present

## 2020-10-18 DIAGNOSIS — J3089 Other allergic rhinitis: Secondary | ICD-10-CM | POA: Diagnosis not present

## 2020-10-25 DIAGNOSIS — J301 Allergic rhinitis due to pollen: Secondary | ICD-10-CM | POA: Diagnosis not present

## 2020-10-25 DIAGNOSIS — J3089 Other allergic rhinitis: Secondary | ICD-10-CM | POA: Diagnosis not present

## 2020-10-25 DIAGNOSIS — J3081 Allergic rhinitis due to animal (cat) (dog) hair and dander: Secondary | ICD-10-CM | POA: Diagnosis not present

## 2020-11-01 DIAGNOSIS — J3081 Allergic rhinitis due to animal (cat) (dog) hair and dander: Secondary | ICD-10-CM | POA: Diagnosis not present

## 2020-11-01 DIAGNOSIS — J301 Allergic rhinitis due to pollen: Secondary | ICD-10-CM | POA: Diagnosis not present

## 2020-11-01 DIAGNOSIS — J3089 Other allergic rhinitis: Secondary | ICD-10-CM | POA: Diagnosis not present

## 2020-11-08 DIAGNOSIS — J3081 Allergic rhinitis due to animal (cat) (dog) hair and dander: Secondary | ICD-10-CM | POA: Diagnosis not present

## 2020-11-08 DIAGNOSIS — J3089 Other allergic rhinitis: Secondary | ICD-10-CM | POA: Diagnosis not present

## 2020-11-08 DIAGNOSIS — J301 Allergic rhinitis due to pollen: Secondary | ICD-10-CM | POA: Diagnosis not present

## 2020-11-15 DIAGNOSIS — J3081 Allergic rhinitis due to animal (cat) (dog) hair and dander: Secondary | ICD-10-CM | POA: Diagnosis not present

## 2020-11-15 DIAGNOSIS — J3089 Other allergic rhinitis: Secondary | ICD-10-CM | POA: Diagnosis not present

## 2020-11-15 DIAGNOSIS — J301 Allergic rhinitis due to pollen: Secondary | ICD-10-CM | POA: Diagnosis not present

## 2020-11-22 DIAGNOSIS — J3081 Allergic rhinitis due to animal (cat) (dog) hair and dander: Secondary | ICD-10-CM | POA: Diagnosis not present

## 2020-11-22 DIAGNOSIS — J3089 Other allergic rhinitis: Secondary | ICD-10-CM | POA: Diagnosis not present

## 2020-11-22 DIAGNOSIS — J301 Allergic rhinitis due to pollen: Secondary | ICD-10-CM | POA: Diagnosis not present

## 2020-11-25 DIAGNOSIS — I10 Essential (primary) hypertension: Secondary | ICD-10-CM | POA: Diagnosis not present

## 2020-11-25 DIAGNOSIS — F419 Anxiety disorder, unspecified: Secondary | ICD-10-CM | POA: Diagnosis not present

## 2020-11-29 DIAGNOSIS — J3089 Other allergic rhinitis: Secondary | ICD-10-CM | POA: Diagnosis not present

## 2020-11-29 DIAGNOSIS — J3081 Allergic rhinitis due to animal (cat) (dog) hair and dander: Secondary | ICD-10-CM | POA: Diagnosis not present

## 2020-11-29 DIAGNOSIS — J301 Allergic rhinitis due to pollen: Secondary | ICD-10-CM | POA: Diagnosis not present

## 2020-12-06 DIAGNOSIS — J3089 Other allergic rhinitis: Secondary | ICD-10-CM | POA: Diagnosis not present

## 2020-12-06 DIAGNOSIS — J301 Allergic rhinitis due to pollen: Secondary | ICD-10-CM | POA: Diagnosis not present

## 2020-12-06 DIAGNOSIS — J3081 Allergic rhinitis due to animal (cat) (dog) hair and dander: Secondary | ICD-10-CM | POA: Diagnosis not present

## 2020-12-13 DIAGNOSIS — J3089 Other allergic rhinitis: Secondary | ICD-10-CM | POA: Diagnosis not present

## 2020-12-13 DIAGNOSIS — J3081 Allergic rhinitis due to animal (cat) (dog) hair and dander: Secondary | ICD-10-CM | POA: Diagnosis not present

## 2020-12-13 DIAGNOSIS — J301 Allergic rhinitis due to pollen: Secondary | ICD-10-CM | POA: Diagnosis not present

## 2020-12-27 DIAGNOSIS — J3089 Other allergic rhinitis: Secondary | ICD-10-CM | POA: Diagnosis not present

## 2020-12-27 DIAGNOSIS — J301 Allergic rhinitis due to pollen: Secondary | ICD-10-CM | POA: Diagnosis not present

## 2020-12-27 DIAGNOSIS — J3081 Allergic rhinitis due to animal (cat) (dog) hair and dander: Secondary | ICD-10-CM | POA: Diagnosis not present

## 2021-01-03 DIAGNOSIS — J3081 Allergic rhinitis due to animal (cat) (dog) hair and dander: Secondary | ICD-10-CM | POA: Diagnosis not present

## 2021-01-03 DIAGNOSIS — J3089 Other allergic rhinitis: Secondary | ICD-10-CM | POA: Diagnosis not present

## 2021-01-03 DIAGNOSIS — J301 Allergic rhinitis due to pollen: Secondary | ICD-10-CM | POA: Diagnosis not present

## 2021-01-24 DIAGNOSIS — J3081 Allergic rhinitis due to animal (cat) (dog) hair and dander: Secondary | ICD-10-CM | POA: Diagnosis not present

## 2021-01-24 DIAGNOSIS — J3089 Other allergic rhinitis: Secondary | ICD-10-CM | POA: Diagnosis not present

## 2021-01-24 DIAGNOSIS — J301 Allergic rhinitis due to pollen: Secondary | ICD-10-CM | POA: Diagnosis not present

## 2021-02-01 DIAGNOSIS — J3081 Allergic rhinitis due to animal (cat) (dog) hair and dander: Secondary | ICD-10-CM | POA: Diagnosis not present

## 2021-02-01 DIAGNOSIS — J3089 Other allergic rhinitis: Secondary | ICD-10-CM | POA: Diagnosis not present

## 2021-02-01 DIAGNOSIS — J301 Allergic rhinitis due to pollen: Secondary | ICD-10-CM | POA: Diagnosis not present

## 2021-02-08 DIAGNOSIS — J3089 Other allergic rhinitis: Secondary | ICD-10-CM | POA: Diagnosis not present

## 2021-02-08 DIAGNOSIS — J301 Allergic rhinitis due to pollen: Secondary | ICD-10-CM | POA: Diagnosis not present

## 2021-02-08 DIAGNOSIS — J3081 Allergic rhinitis due to animal (cat) (dog) hair and dander: Secondary | ICD-10-CM | POA: Diagnosis not present

## 2021-02-14 DIAGNOSIS — J3081 Allergic rhinitis due to animal (cat) (dog) hair and dander: Secondary | ICD-10-CM | POA: Diagnosis not present

## 2021-02-14 DIAGNOSIS — J301 Allergic rhinitis due to pollen: Secondary | ICD-10-CM | POA: Diagnosis not present

## 2021-02-14 DIAGNOSIS — J3089 Other allergic rhinitis: Secondary | ICD-10-CM | POA: Diagnosis not present

## 2021-02-21 DIAGNOSIS — J301 Allergic rhinitis due to pollen: Secondary | ICD-10-CM | POA: Diagnosis not present

## 2021-02-21 DIAGNOSIS — J3081 Allergic rhinitis due to animal (cat) (dog) hair and dander: Secondary | ICD-10-CM | POA: Diagnosis not present

## 2021-02-21 DIAGNOSIS — J3089 Other allergic rhinitis: Secondary | ICD-10-CM | POA: Diagnosis not present

## 2021-02-28 DIAGNOSIS — J3081 Allergic rhinitis due to animal (cat) (dog) hair and dander: Secondary | ICD-10-CM | POA: Diagnosis not present

## 2021-02-28 DIAGNOSIS — J301 Allergic rhinitis due to pollen: Secondary | ICD-10-CM | POA: Diagnosis not present

## 2021-02-28 DIAGNOSIS — J3089 Other allergic rhinitis: Secondary | ICD-10-CM | POA: Diagnosis not present

## 2021-03-06 DIAGNOSIS — J301 Allergic rhinitis due to pollen: Secondary | ICD-10-CM | POA: Diagnosis not present

## 2021-03-06 DIAGNOSIS — J3081 Allergic rhinitis due to animal (cat) (dog) hair and dander: Secondary | ICD-10-CM | POA: Diagnosis not present

## 2021-03-06 DIAGNOSIS — J3089 Other allergic rhinitis: Secondary | ICD-10-CM | POA: Diagnosis not present

## 2021-03-07 DIAGNOSIS — L820 Inflamed seborrheic keratosis: Secondary | ICD-10-CM | POA: Diagnosis not present

## 2021-03-07 DIAGNOSIS — L814 Other melanin hyperpigmentation: Secondary | ICD-10-CM | POA: Diagnosis not present

## 2021-03-07 DIAGNOSIS — L821 Other seborrheic keratosis: Secondary | ICD-10-CM | POA: Diagnosis not present

## 2021-03-07 DIAGNOSIS — D225 Melanocytic nevi of trunk: Secondary | ICD-10-CM | POA: Diagnosis not present

## 2021-03-07 DIAGNOSIS — L578 Other skin changes due to chronic exposure to nonionizing radiation: Secondary | ICD-10-CM | POA: Diagnosis not present

## 2021-03-14 DIAGNOSIS — J301 Allergic rhinitis due to pollen: Secondary | ICD-10-CM | POA: Diagnosis not present

## 2021-03-14 DIAGNOSIS — J3081 Allergic rhinitis due to animal (cat) (dog) hair and dander: Secondary | ICD-10-CM | POA: Diagnosis not present

## 2021-03-14 DIAGNOSIS — J3089 Other allergic rhinitis: Secondary | ICD-10-CM | POA: Diagnosis not present

## 2021-03-17 DIAGNOSIS — Z125 Encounter for screening for malignant neoplasm of prostate: Secondary | ICD-10-CM | POA: Diagnosis not present

## 2021-03-17 DIAGNOSIS — Z79899 Other long term (current) drug therapy: Secondary | ICD-10-CM | POA: Diagnosis not present

## 2021-03-17 DIAGNOSIS — I10 Essential (primary) hypertension: Secondary | ICD-10-CM | POA: Diagnosis not present

## 2021-03-17 DIAGNOSIS — E785 Hyperlipidemia, unspecified: Secondary | ICD-10-CM | POA: Diagnosis not present

## 2021-03-17 DIAGNOSIS — F419 Anxiety disorder, unspecified: Secondary | ICD-10-CM | POA: Diagnosis not present

## 2021-03-21 DIAGNOSIS — J3089 Other allergic rhinitis: Secondary | ICD-10-CM | POA: Diagnosis not present

## 2021-03-21 DIAGNOSIS — J301 Allergic rhinitis due to pollen: Secondary | ICD-10-CM | POA: Diagnosis not present

## 2021-03-21 DIAGNOSIS — J3081 Allergic rhinitis due to animal (cat) (dog) hair and dander: Secondary | ICD-10-CM | POA: Diagnosis not present

## 2021-03-24 DIAGNOSIS — I10 Essential (primary) hypertension: Secondary | ICD-10-CM | POA: Diagnosis not present

## 2021-03-24 DIAGNOSIS — E785 Hyperlipidemia, unspecified: Secondary | ICD-10-CM | POA: Diagnosis not present

## 2021-03-28 DIAGNOSIS — H1045 Other chronic allergic conjunctivitis: Secondary | ICD-10-CM | POA: Diagnosis not present

## 2021-03-28 DIAGNOSIS — J3089 Other allergic rhinitis: Secondary | ICD-10-CM | POA: Diagnosis not present

## 2021-03-28 DIAGNOSIS — J3081 Allergic rhinitis due to animal (cat) (dog) hair and dander: Secondary | ICD-10-CM | POA: Diagnosis not present

## 2021-03-28 DIAGNOSIS — J301 Allergic rhinitis due to pollen: Secondary | ICD-10-CM | POA: Diagnosis not present

## 2021-04-04 DIAGNOSIS — J301 Allergic rhinitis due to pollen: Secondary | ICD-10-CM | POA: Diagnosis not present

## 2021-04-04 DIAGNOSIS — J3089 Other allergic rhinitis: Secondary | ICD-10-CM | POA: Diagnosis not present

## 2021-04-04 DIAGNOSIS — J3081 Allergic rhinitis due to animal (cat) (dog) hair and dander: Secondary | ICD-10-CM | POA: Diagnosis not present

## 2021-04-11 DIAGNOSIS — J3089 Other allergic rhinitis: Secondary | ICD-10-CM | POA: Diagnosis not present

## 2021-04-11 DIAGNOSIS — J3081 Allergic rhinitis due to animal (cat) (dog) hair and dander: Secondary | ICD-10-CM | POA: Diagnosis not present

## 2021-04-11 DIAGNOSIS — J301 Allergic rhinitis due to pollen: Secondary | ICD-10-CM | POA: Diagnosis not present

## 2021-04-18 DIAGNOSIS — J3081 Allergic rhinitis due to animal (cat) (dog) hair and dander: Secondary | ICD-10-CM | POA: Diagnosis not present

## 2021-04-18 DIAGNOSIS — J301 Allergic rhinitis due to pollen: Secondary | ICD-10-CM | POA: Diagnosis not present

## 2021-04-18 DIAGNOSIS — J3089 Other allergic rhinitis: Secondary | ICD-10-CM | POA: Diagnosis not present

## 2021-04-25 DIAGNOSIS — J3081 Allergic rhinitis due to animal (cat) (dog) hair and dander: Secondary | ICD-10-CM | POA: Diagnosis not present

## 2021-04-25 DIAGNOSIS — J3089 Other allergic rhinitis: Secondary | ICD-10-CM | POA: Diagnosis not present

## 2021-04-25 DIAGNOSIS — J301 Allergic rhinitis due to pollen: Secondary | ICD-10-CM | POA: Diagnosis not present

## 2021-04-28 ENCOUNTER — Other Ambulatory Visit (HOSPITAL_COMMUNITY): Payer: Self-pay | Admitting: Internal Medicine

## 2021-05-02 DIAGNOSIS — J3089 Other allergic rhinitis: Secondary | ICD-10-CM | POA: Diagnosis not present

## 2021-05-02 DIAGNOSIS — J301 Allergic rhinitis due to pollen: Secondary | ICD-10-CM | POA: Diagnosis not present

## 2021-05-02 DIAGNOSIS — J3081 Allergic rhinitis due to animal (cat) (dog) hair and dander: Secondary | ICD-10-CM | POA: Diagnosis not present

## 2021-05-09 DIAGNOSIS — J3089 Other allergic rhinitis: Secondary | ICD-10-CM | POA: Diagnosis not present

## 2021-05-09 DIAGNOSIS — J301 Allergic rhinitis due to pollen: Secondary | ICD-10-CM | POA: Diagnosis not present

## 2021-05-09 DIAGNOSIS — J3081 Allergic rhinitis due to animal (cat) (dog) hair and dander: Secondary | ICD-10-CM | POA: Diagnosis not present

## 2021-05-10 DIAGNOSIS — J301 Allergic rhinitis due to pollen: Secondary | ICD-10-CM | POA: Diagnosis not present

## 2021-05-11 DIAGNOSIS — J3081 Allergic rhinitis due to animal (cat) (dog) hair and dander: Secondary | ICD-10-CM | POA: Diagnosis not present

## 2021-05-11 DIAGNOSIS — J3089 Other allergic rhinitis: Secondary | ICD-10-CM | POA: Diagnosis not present

## 2021-05-16 DIAGNOSIS — J301 Allergic rhinitis due to pollen: Secondary | ICD-10-CM | POA: Diagnosis not present

## 2021-05-16 DIAGNOSIS — J3089 Other allergic rhinitis: Secondary | ICD-10-CM | POA: Diagnosis not present

## 2021-05-16 DIAGNOSIS — J3081 Allergic rhinitis due to animal (cat) (dog) hair and dander: Secondary | ICD-10-CM | POA: Diagnosis not present

## 2021-05-23 DIAGNOSIS — J301 Allergic rhinitis due to pollen: Secondary | ICD-10-CM | POA: Diagnosis not present

## 2021-05-23 DIAGNOSIS — J3081 Allergic rhinitis due to animal (cat) (dog) hair and dander: Secondary | ICD-10-CM | POA: Diagnosis not present

## 2021-05-23 DIAGNOSIS — J3089 Other allergic rhinitis: Secondary | ICD-10-CM | POA: Diagnosis not present

## 2021-05-25 ENCOUNTER — Other Ambulatory Visit: Payer: Self-pay

## 2021-05-25 ENCOUNTER — Ambulatory Visit (HOSPITAL_COMMUNITY)
Admission: RE | Admit: 2021-05-25 | Discharge: 2021-05-25 | Disposition: A | Discharge status: Home / Self Care | Payer: Self-pay | Source: Ambulatory Visit | Attending: Internal Medicine | Admitting: Internal Medicine

## 2021-05-25 DIAGNOSIS — Z136 Encounter for screening for cardiovascular disorders: Secondary | ICD-10-CM | POA: Insufficient documentation

## 2021-05-30 DIAGNOSIS — J3089 Other allergic rhinitis: Secondary | ICD-10-CM | POA: Diagnosis not present

## 2021-05-30 DIAGNOSIS — J301 Allergic rhinitis due to pollen: Secondary | ICD-10-CM | POA: Diagnosis not present

## 2021-05-30 DIAGNOSIS — J3081 Allergic rhinitis due to animal (cat) (dog) hair and dander: Secondary | ICD-10-CM | POA: Diagnosis not present

## 2021-05-31 ENCOUNTER — Telehealth: Payer: Self-pay

## 2021-05-31 NOTE — Telephone Encounter (Signed)
NOTES SCANNED TO REFERRAL 

## 2021-06-05 DIAGNOSIS — J3081 Allergic rhinitis due to animal (cat) (dog) hair and dander: Secondary | ICD-10-CM | POA: Diagnosis not present

## 2021-06-05 DIAGNOSIS — J301 Allergic rhinitis due to pollen: Secondary | ICD-10-CM | POA: Diagnosis not present

## 2021-06-05 DIAGNOSIS — J3089 Other allergic rhinitis: Secondary | ICD-10-CM | POA: Diagnosis not present

## 2021-06-14 DIAGNOSIS — J3089 Other allergic rhinitis: Secondary | ICD-10-CM | POA: Diagnosis not present

## 2021-06-14 DIAGNOSIS — J3081 Allergic rhinitis due to animal (cat) (dog) hair and dander: Secondary | ICD-10-CM | POA: Diagnosis not present

## 2021-06-14 DIAGNOSIS — J301 Allergic rhinitis due to pollen: Secondary | ICD-10-CM | POA: Diagnosis not present

## 2021-06-19 DIAGNOSIS — J3081 Allergic rhinitis due to animal (cat) (dog) hair and dander: Secondary | ICD-10-CM | POA: Diagnosis not present

## 2021-06-19 DIAGNOSIS — J301 Allergic rhinitis due to pollen: Secondary | ICD-10-CM | POA: Diagnosis not present

## 2021-06-19 DIAGNOSIS — J3089 Other allergic rhinitis: Secondary | ICD-10-CM | POA: Diagnosis not present

## 2021-06-26 DIAGNOSIS — J3081 Allergic rhinitis due to animal (cat) (dog) hair and dander: Secondary | ICD-10-CM | POA: Diagnosis not present

## 2021-06-26 DIAGNOSIS — J3089 Other allergic rhinitis: Secondary | ICD-10-CM | POA: Diagnosis not present

## 2021-06-26 DIAGNOSIS — J301 Allergic rhinitis due to pollen: Secondary | ICD-10-CM | POA: Diagnosis not present

## 2021-06-28 NOTE — Progress Notes (Signed)
CARDIOLOGY CONSULT NOTE       Patient ID: Logan Solis MRN: [id number] DOB/AGE: Dec 17, 1965 55 y.o.  Admit date: (Not on file) Referring Physician: Ouida Sills Primary Physician: Carylon Perches, MD Primary Cardiologist: New Reason for Consultation: CAD  Active Problems:   * No active hospital problems. *   HPI:  55 y.o. referred by Dr Ouida Sills for CAD And high calcium score Reviewed scan done 05/25/21 Total score 203 isolated to LAD 85 th percentile for age/sex. He is on norvasc and Hyzaar for HTN.  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.   ROS All other systems reviewed and negative except as noted above  Past Medical History:  Diagnosis Date   Hypertension     Family History  Problem Relation Age of Onset   Healthy Mother    Healthy Father     Social History   Socioeconomic History   Marital status: Married    Spouse name: Not on file   Number of children: Not on file   Years of education: Not on file   Highest education level: Not on file  Occupational History   Not on file  Tobacco Use   Smoking status: Never   Smokeless tobacco: Never  Substance and Sexual Activity   Alcohol use: Never   Drug use: Never   Sexual activity: Not on file  Other Topics Concern   Not on file  Social History Narrative   Not on file   Social Determinants of Health   Financial Resource Strain: Not on file  Food Insecurity: Not on file  Transportation Needs: Not on file  Physical Activity: Not on file  Stress: Not on file  Social Connections: Not on file  Intimate Partner Violence: Not on file    Past Surgical History:  Procedure Laterality Date   BACK SURGERY     SHOULDER SURGERY        Current Outpatient Medications:    amLODipine (NORVASC) 10 MG tablet,  Take 10 mg by mouth daily., Disp: , Rfl:    AUVI-Q 0.3 MG/0.3ML SOAJ injection, Inject into the muscle as directed., Disp: , Rfl:    Azelastine-Fluticasone (DYMISTA NA), Place into the nose., Disp: , Rfl:    citalopram (CELEXA) 20 MG tablet, Take 20 mg by mouth daily., Disp: , Rfl:    levocetirizine (XYZAL) 5 MG tablet, Take 5 mg by mouth every evening., Disp: , Rfl:    losartan-hydrochlorothiazide (HYZAAR) 100-25 MG tablet, Take 1 tablet by mouth daily., Disp: , Rfl:    rosuvastatin (CRESTOR) 20 MG tablet, Take 20 mg by mouth at bedtime., Disp: , Rfl:     Physical Exam:  BP 110/72   Pulse 69   Ht 5\' 9"  (1.753 m)   Wt 211 lb (95.7 kg)   SpO2 97%   BMI 31.16 kg/m  Affect appropriate Healthy:  appears stated age HEENT: normal Neck supple with no adenopathy JVP normal no bruits no thyromegaly Lungs clear with no wheezing and good diaphragmatic motion Heart:  S1/S2 no murmur, no rub, gallop or click PMI normal Abdomen: benighn, BS positve, no tenderness, no AAA no bruit.  No HSM or HJR Distal pulses intact with no bruits No edema Neuro non-focal Skin warm and dry No muscular  weakness   Labs:   Lab Results  Component Value Date   WBC 4.2 11/02/2010   HGB 14.7 11/02/2010   HCT 42.6 11/02/2010   MCV 84.9 11/02/2010   PLT 187 11/02/2010   No results for input(s): NA, K, CL, CO2, BUN, CREATININE, CALCIUM, PROT, BILITOT, ALKPHOS, ALT, AST, GLUCOSE in the last 168 hours.  Invalid input(s): LABALBU No results found for: CKTOTAL, CKMB, CKMBINDEX, TROPONINI No results found for: CHOL No results found for: HDL No results found for: LDLCALC No results found for: TRIG No results found for: CHOLHDL No results found for: LDLDIRECT    Radiology: No results found.  EKG: SR rate 69 normal    ASSESSMENT AND PLAN:   CAD: subclinical with high calcium score involving only the LAD ETT F/u ETT baseline ASA and statin  HLD:  see above start crestor 20 mg daily f/u labs in 3  months  ETT F/U in a year  F/U labs with primary   Signed: Charlton Haws 07/04/2021, 9:23 AM

## 2021-07-03 DIAGNOSIS — J301 Allergic rhinitis due to pollen: Secondary | ICD-10-CM | POA: Diagnosis not present

## 2021-07-03 DIAGNOSIS — J3081 Allergic rhinitis due to animal (cat) (dog) hair and dander: Secondary | ICD-10-CM | POA: Diagnosis not present

## 2021-07-03 DIAGNOSIS — J3089 Other allergic rhinitis: Secondary | ICD-10-CM | POA: Diagnosis not present

## 2021-07-04 ENCOUNTER — Encounter: Payer: Self-pay | Admitting: Cardiovascular Disease

## 2021-07-04 ENCOUNTER — Ambulatory Visit: Payer: BC Managed Care – PPO | Admitting: Cardiovascular Disease

## 2021-07-04 ENCOUNTER — Other Ambulatory Visit: Payer: Self-pay

## 2021-07-04 VITALS — BP 110/72 | HR 69 | Ht 69.0 in | Wt 211.0 lb

## 2021-07-04 DIAGNOSIS — I2510 Atherosclerotic heart disease of native coronary artery without angina pectoris: Secondary | ICD-10-CM

## 2021-07-04 DIAGNOSIS — E782 Mixed hyperlipidemia: Secondary | ICD-10-CM

## 2021-07-04 NOTE — Addendum Note (Signed)
Addended by: Makaia Rappa N on: 07/04/2021 04:04 PM   Modules accepted: Orders  

## 2021-07-04 NOTE — Addendum Note (Signed)
Addended by: Wendall Stade on: 07/04/2021 05:42 PM   Modules accepted: Orders

## 2021-07-04 NOTE — Patient Instructions (Signed)
Medication Instructions:  Your physician recommends that you continue on your current medications as directed. Please refer to the Current Medication list given to you today.  Lab Work: NONE If you have labs (blood work) drawn today and your tests are completely normal, you will receive your results only by: MyChart Message (if you have MyChart) OR A paper copy in the mail If you have any lab test that is abnormal or we need to change your treatment, we will call you to review the results.   Testing/Procedures: Your physician has requested that you have an exercise tolerance test. For further information please visit [email or web address] Please also follow instruction sheet, as given.    Follow-Up: At [facility], you and your health needs are our priority.  As part of our continuing mission to provide you with exceptional heart care, we have created designated Provider Care Teams.  These Care Teams include your primary Cardiologist (physician) and Advanced Practice Providers (APPs -  Physician Assistants and Nurse Practitioners) who all work together to provide you with the care you need, when you need it.   Your next appointment:   12 month(s)  The format for your next appointment:   In Person  Provider:   Charlton Haws, MD {

## 2021-07-06 ENCOUNTER — Other Ambulatory Visit: Payer: Self-pay

## 2021-07-06 ENCOUNTER — Ambulatory Visit (INDEPENDENT_AMBULATORY_CARE_PROVIDER_SITE_OTHER): Payer: BC Managed Care – PPO

## 2021-07-06 DIAGNOSIS — I2510 Atherosclerotic heart disease of native coronary artery without angina pectoris: Secondary | ICD-10-CM

## 2021-07-06 DIAGNOSIS — E782 Mixed hyperlipidemia: Secondary | ICD-10-CM

## 2021-07-06 LAB — EXERCISE TOLERANCE TEST
Angina Index: 0
Duke Treadmill Score: 10
Estimated workload: 10.9
Exercise duration (min): 9 min
Exercise duration (sec): 30 s
MPHR: 163 {beats}/min
Peak HR: 150 {beats}/min
Percent HR: 163 %
RPE: 18
Rest HR: 77 {beats}/min
ST Depression (mm): 0 mm

## 2021-07-11 DIAGNOSIS — J3081 Allergic rhinitis due to animal (cat) (dog) hair and dander: Secondary | ICD-10-CM | POA: Diagnosis not present

## 2021-07-11 DIAGNOSIS — J3089 Other allergic rhinitis: Secondary | ICD-10-CM | POA: Diagnosis not present

## 2021-07-11 DIAGNOSIS — J301 Allergic rhinitis due to pollen: Secondary | ICD-10-CM | POA: Diagnosis not present

## 2021-07-12 DIAGNOSIS — Z79899 Other long term (current) drug therapy: Secondary | ICD-10-CM | POA: Diagnosis not present

## 2021-07-12 DIAGNOSIS — F419 Anxiety disorder, unspecified: Secondary | ICD-10-CM | POA: Diagnosis not present

## 2021-07-12 DIAGNOSIS — E785 Hyperlipidemia, unspecified: Secondary | ICD-10-CM | POA: Diagnosis not present

## 2021-07-12 DIAGNOSIS — I10 Essential (primary) hypertension: Secondary | ICD-10-CM | POA: Diagnosis not present

## 2021-07-17 DIAGNOSIS — J3081 Allergic rhinitis due to animal (cat) (dog) hair and dander: Secondary | ICD-10-CM | POA: Diagnosis not present

## 2021-07-17 DIAGNOSIS — J301 Allergic rhinitis due to pollen: Secondary | ICD-10-CM | POA: Diagnosis not present

## 2021-07-17 DIAGNOSIS — J3089 Other allergic rhinitis: Secondary | ICD-10-CM | POA: Diagnosis not present

## 2021-07-19 DIAGNOSIS — E785 Hyperlipidemia, unspecified: Secondary | ICD-10-CM | POA: Diagnosis not present

## 2021-07-19 DIAGNOSIS — I10 Essential (primary) hypertension: Secondary | ICD-10-CM | POA: Diagnosis not present

## 2021-07-25 DIAGNOSIS — J301 Allergic rhinitis due to pollen: Secondary | ICD-10-CM | POA: Diagnosis not present

## 2021-07-25 DIAGNOSIS — J3081 Allergic rhinitis due to animal (cat) (dog) hair and dander: Secondary | ICD-10-CM | POA: Diagnosis not present

## 2021-07-25 DIAGNOSIS — J3089 Other allergic rhinitis: Secondary | ICD-10-CM | POA: Diagnosis not present

## 2021-08-01 DIAGNOSIS — J3081 Allergic rhinitis due to animal (cat) (dog) hair and dander: Secondary | ICD-10-CM | POA: Diagnosis not present

## 2021-08-01 DIAGNOSIS — J301 Allergic rhinitis due to pollen: Secondary | ICD-10-CM | POA: Diagnosis not present

## 2021-08-01 DIAGNOSIS — J3089 Other allergic rhinitis: Secondary | ICD-10-CM | POA: Diagnosis not present

## 2021-08-07 DIAGNOSIS — J301 Allergic rhinitis due to pollen: Secondary | ICD-10-CM | POA: Diagnosis not present

## 2021-08-07 DIAGNOSIS — J3081 Allergic rhinitis due to animal (cat) (dog) hair and dander: Secondary | ICD-10-CM | POA: Diagnosis not present

## 2021-08-07 DIAGNOSIS — J3089 Other allergic rhinitis: Secondary | ICD-10-CM | POA: Diagnosis not present

## 2021-08-14 DIAGNOSIS — J3089 Other allergic rhinitis: Secondary | ICD-10-CM | POA: Diagnosis not present

## 2021-08-14 DIAGNOSIS — J3081 Allergic rhinitis due to animal (cat) (dog) hair and dander: Secondary | ICD-10-CM | POA: Diagnosis not present

## 2021-08-14 DIAGNOSIS — J301 Allergic rhinitis due to pollen: Secondary | ICD-10-CM | POA: Diagnosis not present

## 2021-09-01 DIAGNOSIS — J3089 Other allergic rhinitis: Secondary | ICD-10-CM | POA: Diagnosis not present

## 2021-09-01 DIAGNOSIS — J301 Allergic rhinitis due to pollen: Secondary | ICD-10-CM | POA: Diagnosis not present

## 2021-09-01 DIAGNOSIS — J3081 Allergic rhinitis due to animal (cat) (dog) hair and dander: Secondary | ICD-10-CM | POA: Diagnosis not present

## 2021-09-06 DIAGNOSIS — J3089 Other allergic rhinitis: Secondary | ICD-10-CM | POA: Diagnosis not present

## 2021-09-06 DIAGNOSIS — J3081 Allergic rhinitis due to animal (cat) (dog) hair and dander: Secondary | ICD-10-CM | POA: Diagnosis not present

## 2021-09-06 DIAGNOSIS — J301 Allergic rhinitis due to pollen: Secondary | ICD-10-CM | POA: Diagnosis not present

## 2021-09-22 DIAGNOSIS — J3081 Allergic rhinitis due to animal (cat) (dog) hair and dander: Secondary | ICD-10-CM | POA: Diagnosis not present

## 2021-09-22 DIAGNOSIS — J301 Allergic rhinitis due to pollen: Secondary | ICD-10-CM | POA: Diagnosis not present

## 2021-09-22 DIAGNOSIS — J3089 Other allergic rhinitis: Secondary | ICD-10-CM | POA: Diagnosis not present

## 2021-09-25 DIAGNOSIS — D225 Melanocytic nevi of trunk: Secondary | ICD-10-CM | POA: Diagnosis not present

## 2021-09-25 DIAGNOSIS — D2272 Melanocytic nevi of left lower limb, including hip: Secondary | ICD-10-CM | POA: Diagnosis not present

## 2021-09-25 DIAGNOSIS — D2271 Melanocytic nevi of right lower limb, including hip: Secondary | ICD-10-CM | POA: Diagnosis not present

## 2021-09-25 DIAGNOSIS — L821 Other seborrheic keratosis: Secondary | ICD-10-CM | POA: Diagnosis not present

## 2021-09-25 DIAGNOSIS — L570 Actinic keratosis: Secondary | ICD-10-CM | POA: Diagnosis not present

## 2021-09-25 DIAGNOSIS — L820 Inflamed seborrheic keratosis: Secondary | ICD-10-CM | POA: Diagnosis not present

## 2021-09-26 DIAGNOSIS — J3089 Other allergic rhinitis: Secondary | ICD-10-CM | POA: Diagnosis not present

## 2021-09-26 DIAGNOSIS — J301 Allergic rhinitis due to pollen: Secondary | ICD-10-CM | POA: Diagnosis not present

## 2021-09-26 DIAGNOSIS — J3081 Allergic rhinitis due to animal (cat) (dog) hair and dander: Secondary | ICD-10-CM | POA: Diagnosis not present

## 2021-10-05 DIAGNOSIS — J301 Allergic rhinitis due to pollen: Secondary | ICD-10-CM | POA: Diagnosis not present

## 2021-10-05 DIAGNOSIS — J3081 Allergic rhinitis due to animal (cat) (dog) hair and dander: Secondary | ICD-10-CM | POA: Diagnosis not present

## 2021-10-05 DIAGNOSIS — J3089 Other allergic rhinitis: Secondary | ICD-10-CM | POA: Diagnosis not present

## 2021-10-10 DIAGNOSIS — J3089 Other allergic rhinitis: Secondary | ICD-10-CM | POA: Diagnosis not present

## 2021-10-10 DIAGNOSIS — J3081 Allergic rhinitis due to animal (cat) (dog) hair and dander: Secondary | ICD-10-CM | POA: Diagnosis not present

## 2021-10-10 DIAGNOSIS — J301 Allergic rhinitis due to pollen: Secondary | ICD-10-CM | POA: Diagnosis not present

## 2021-10-18 DIAGNOSIS — J301 Allergic rhinitis due to pollen: Secondary | ICD-10-CM | POA: Diagnosis not present

## 2021-10-18 DIAGNOSIS — J3081 Allergic rhinitis due to animal (cat) (dog) hair and dander: Secondary | ICD-10-CM | POA: Diagnosis not present

## 2021-10-18 DIAGNOSIS — J3089 Other allergic rhinitis: Secondary | ICD-10-CM | POA: Diagnosis not present

## 2021-10-24 DIAGNOSIS — J301 Allergic rhinitis due to pollen: Secondary | ICD-10-CM | POA: Diagnosis not present

## 2021-10-24 DIAGNOSIS — J3081 Allergic rhinitis due to animal (cat) (dog) hair and dander: Secondary | ICD-10-CM | POA: Diagnosis not present

## 2021-10-24 DIAGNOSIS — J3089 Other allergic rhinitis: Secondary | ICD-10-CM | POA: Diagnosis not present

## 2021-10-31 DIAGNOSIS — J3081 Allergic rhinitis due to animal (cat) (dog) hair and dander: Secondary | ICD-10-CM | POA: Diagnosis not present

## 2021-10-31 DIAGNOSIS — J3089 Other allergic rhinitis: Secondary | ICD-10-CM | POA: Diagnosis not present

## 2021-10-31 DIAGNOSIS — J301 Allergic rhinitis due to pollen: Secondary | ICD-10-CM | POA: Diagnosis not present

## 2021-11-07 DIAGNOSIS — J3081 Allergic rhinitis due to animal (cat) (dog) hair and dander: Secondary | ICD-10-CM | POA: Diagnosis not present

## 2021-11-07 DIAGNOSIS — J301 Allergic rhinitis due to pollen: Secondary | ICD-10-CM | POA: Diagnosis not present

## 2021-11-07 DIAGNOSIS — J3089 Other allergic rhinitis: Secondary | ICD-10-CM | POA: Diagnosis not present

## 2021-11-17 DIAGNOSIS — J301 Allergic rhinitis due to pollen: Secondary | ICD-10-CM | POA: Diagnosis not present

## 2021-11-17 DIAGNOSIS — F411 Generalized anxiety disorder: Secondary | ICD-10-CM | POA: Diagnosis not present

## 2021-11-17 DIAGNOSIS — I10 Essential (primary) hypertension: Secondary | ICD-10-CM | POA: Diagnosis not present

## 2021-11-18 DIAGNOSIS — J3081 Allergic rhinitis due to animal (cat) (dog) hair and dander: Secondary | ICD-10-CM | POA: Diagnosis not present

## 2021-11-18 DIAGNOSIS — J3089 Other allergic rhinitis: Secondary | ICD-10-CM | POA: Diagnosis not present

## 2021-11-21 DIAGNOSIS — J3081 Allergic rhinitis due to animal (cat) (dog) hair and dander: Secondary | ICD-10-CM | POA: Diagnosis not present

## 2021-11-21 DIAGNOSIS — J301 Allergic rhinitis due to pollen: Secondary | ICD-10-CM | POA: Diagnosis not present

## 2021-11-21 DIAGNOSIS — J3089 Other allergic rhinitis: Secondary | ICD-10-CM | POA: Diagnosis not present

## 2021-11-28 DIAGNOSIS — J3081 Allergic rhinitis due to animal (cat) (dog) hair and dander: Secondary | ICD-10-CM | POA: Diagnosis not present

## 2021-11-28 DIAGNOSIS — J301 Allergic rhinitis due to pollen: Secondary | ICD-10-CM | POA: Diagnosis not present

## 2021-11-28 DIAGNOSIS — J3089 Other allergic rhinitis: Secondary | ICD-10-CM | POA: Diagnosis not present

## 2021-12-05 DIAGNOSIS — J3081 Allergic rhinitis due to animal (cat) (dog) hair and dander: Secondary | ICD-10-CM | POA: Diagnosis not present

## 2021-12-05 DIAGNOSIS — J301 Allergic rhinitis due to pollen: Secondary | ICD-10-CM | POA: Diagnosis not present

## 2021-12-05 DIAGNOSIS — J3089 Other allergic rhinitis: Secondary | ICD-10-CM | POA: Diagnosis not present

## 2021-12-08 DIAGNOSIS — Z23 Encounter for immunization: Secondary | ICD-10-CM | POA: Diagnosis not present

## 2021-12-12 DIAGNOSIS — J3081 Allergic rhinitis due to animal (cat) (dog) hair and dander: Secondary | ICD-10-CM | POA: Diagnosis not present

## 2021-12-12 DIAGNOSIS — J3089 Other allergic rhinitis: Secondary | ICD-10-CM | POA: Diagnosis not present

## 2021-12-12 DIAGNOSIS — J301 Allergic rhinitis due to pollen: Secondary | ICD-10-CM | POA: Diagnosis not present

## 2021-12-26 DIAGNOSIS — J3089 Other allergic rhinitis: Secondary | ICD-10-CM | POA: Diagnosis not present

## 2021-12-26 DIAGNOSIS — J3081 Allergic rhinitis due to animal (cat) (dog) hair and dander: Secondary | ICD-10-CM | POA: Diagnosis not present

## 2021-12-26 DIAGNOSIS — J301 Allergic rhinitis due to pollen: Secondary | ICD-10-CM | POA: Diagnosis not present

## 2022-01-02 DIAGNOSIS — J3081 Allergic rhinitis due to animal (cat) (dog) hair and dander: Secondary | ICD-10-CM | POA: Diagnosis not present

## 2022-01-02 DIAGNOSIS — J3089 Other allergic rhinitis: Secondary | ICD-10-CM | POA: Diagnosis not present

## 2022-01-02 DIAGNOSIS — J301 Allergic rhinitis due to pollen: Secondary | ICD-10-CM | POA: Diagnosis not present

## 2022-01-14 DIAGNOSIS — J069 Acute upper respiratory infection, unspecified: Secondary | ICD-10-CM | POA: Diagnosis not present

## 2022-01-22 DIAGNOSIS — J3081 Allergic rhinitis due to animal (cat) (dog) hair and dander: Secondary | ICD-10-CM | POA: Diagnosis not present

## 2022-01-22 DIAGNOSIS — J3089 Other allergic rhinitis: Secondary | ICD-10-CM | POA: Diagnosis not present

## 2022-01-22 DIAGNOSIS — J301 Allergic rhinitis due to pollen: Secondary | ICD-10-CM | POA: Diagnosis not present

## 2022-01-31 DIAGNOSIS — J301 Allergic rhinitis due to pollen: Secondary | ICD-10-CM | POA: Diagnosis not present

## 2022-01-31 DIAGNOSIS — J3081 Allergic rhinitis due to animal (cat) (dog) hair and dander: Secondary | ICD-10-CM | POA: Diagnosis not present

## 2022-01-31 DIAGNOSIS — J3089 Other allergic rhinitis: Secondary | ICD-10-CM | POA: Diagnosis not present

## 2022-02-08 DIAGNOSIS — J301 Allergic rhinitis due to pollen: Secondary | ICD-10-CM | POA: Diagnosis not present

## 2022-02-08 DIAGNOSIS — J3089 Other allergic rhinitis: Secondary | ICD-10-CM | POA: Diagnosis not present

## 2022-02-08 DIAGNOSIS — J3081 Allergic rhinitis due to animal (cat) (dog) hair and dander: Secondary | ICD-10-CM | POA: Diagnosis not present

## 2022-02-12 DIAGNOSIS — J301 Allergic rhinitis due to pollen: Secondary | ICD-10-CM | POA: Diagnosis not present

## 2022-02-12 DIAGNOSIS — J3089 Other allergic rhinitis: Secondary | ICD-10-CM | POA: Diagnosis not present

## 2022-02-12 DIAGNOSIS — J3081 Allergic rhinitis due to animal (cat) (dog) hair and dander: Secondary | ICD-10-CM | POA: Diagnosis not present

## 2022-02-19 DIAGNOSIS — J3081 Allergic rhinitis due to animal (cat) (dog) hair and dander: Secondary | ICD-10-CM | POA: Diagnosis not present

## 2022-02-19 DIAGNOSIS — J301 Allergic rhinitis due to pollen: Secondary | ICD-10-CM | POA: Diagnosis not present

## 2022-02-19 DIAGNOSIS — J3089 Other allergic rhinitis: Secondary | ICD-10-CM | POA: Diagnosis not present

## 2022-02-26 DIAGNOSIS — J3081 Allergic rhinitis due to animal (cat) (dog) hair and dander: Secondary | ICD-10-CM | POA: Diagnosis not present

## 2022-02-26 DIAGNOSIS — L820 Inflamed seborrheic keratosis: Secondary | ICD-10-CM | POA: Diagnosis not present

## 2022-02-26 DIAGNOSIS — J3089 Other allergic rhinitis: Secondary | ICD-10-CM | POA: Diagnosis not present

## 2022-02-26 DIAGNOSIS — J301 Allergic rhinitis due to pollen: Secondary | ICD-10-CM | POA: Diagnosis not present

## 2022-03-05 DIAGNOSIS — J301 Allergic rhinitis due to pollen: Secondary | ICD-10-CM | POA: Diagnosis not present

## 2022-03-05 DIAGNOSIS — J3089 Other allergic rhinitis: Secondary | ICD-10-CM | POA: Diagnosis not present

## 2022-03-05 DIAGNOSIS — J3081 Allergic rhinitis due to animal (cat) (dog) hair and dander: Secondary | ICD-10-CM | POA: Diagnosis not present

## 2022-03-13 DIAGNOSIS — J3089 Other allergic rhinitis: Secondary | ICD-10-CM | POA: Diagnosis not present

## 2022-03-13 DIAGNOSIS — J301 Allergic rhinitis due to pollen: Secondary | ICD-10-CM | POA: Diagnosis not present

## 2022-03-13 DIAGNOSIS — J3081 Allergic rhinitis due to animal (cat) (dog) hair and dander: Secondary | ICD-10-CM | POA: Diagnosis not present

## 2022-03-16 DIAGNOSIS — Z23 Encounter for immunization: Secondary | ICD-10-CM | POA: Diagnosis not present

## 2022-03-23 DIAGNOSIS — R052 Subacute cough: Secondary | ICD-10-CM | POA: Diagnosis not present

## 2022-03-23 DIAGNOSIS — J3081 Allergic rhinitis due to animal (cat) (dog) hair and dander: Secondary | ICD-10-CM | POA: Diagnosis not present

## 2022-03-23 DIAGNOSIS — J3089 Other allergic rhinitis: Secondary | ICD-10-CM | POA: Diagnosis not present

## 2022-03-23 DIAGNOSIS — H1045 Other chronic allergic conjunctivitis: Secondary | ICD-10-CM | POA: Diagnosis not present

## 2022-03-23 DIAGNOSIS — J301 Allergic rhinitis due to pollen: Secondary | ICD-10-CM | POA: Diagnosis not present

## 2022-04-04 DIAGNOSIS — J3089 Other allergic rhinitis: Secondary | ICD-10-CM | POA: Diagnosis not present

## 2022-04-04 DIAGNOSIS — J301 Allergic rhinitis due to pollen: Secondary | ICD-10-CM | POA: Diagnosis not present

## 2022-04-04 DIAGNOSIS — J3081 Allergic rhinitis due to animal (cat) (dog) hair and dander: Secondary | ICD-10-CM | POA: Diagnosis not present

## 2022-04-11 DIAGNOSIS — J3081 Allergic rhinitis due to animal (cat) (dog) hair and dander: Secondary | ICD-10-CM | POA: Diagnosis not present

## 2022-04-11 DIAGNOSIS — J3089 Other allergic rhinitis: Secondary | ICD-10-CM | POA: Diagnosis not present

## 2022-04-11 DIAGNOSIS — J301 Allergic rhinitis due to pollen: Secondary | ICD-10-CM | POA: Diagnosis not present

## 2022-04-18 DIAGNOSIS — J301 Allergic rhinitis due to pollen: Secondary | ICD-10-CM | POA: Diagnosis not present

## 2022-04-18 DIAGNOSIS — J3081 Allergic rhinitis due to animal (cat) (dog) hair and dander: Secondary | ICD-10-CM | POA: Diagnosis not present

## 2022-04-18 DIAGNOSIS — J3089 Other allergic rhinitis: Secondary | ICD-10-CM | POA: Diagnosis not present

## 2022-04-25 DIAGNOSIS — J3081 Allergic rhinitis due to animal (cat) (dog) hair and dander: Secondary | ICD-10-CM | POA: Diagnosis not present

## 2022-04-25 DIAGNOSIS — J301 Allergic rhinitis due to pollen: Secondary | ICD-10-CM | POA: Diagnosis not present

## 2022-04-25 DIAGNOSIS — J3089 Other allergic rhinitis: Secondary | ICD-10-CM | POA: Diagnosis not present

## 2022-05-01 DIAGNOSIS — J301 Allergic rhinitis due to pollen: Secondary | ICD-10-CM | POA: Diagnosis not present

## 2022-05-01 DIAGNOSIS — J3081 Allergic rhinitis due to animal (cat) (dog) hair and dander: Secondary | ICD-10-CM | POA: Diagnosis not present

## 2022-05-01 DIAGNOSIS — J3089 Other allergic rhinitis: Secondary | ICD-10-CM | POA: Diagnosis not present

## 2022-05-08 DIAGNOSIS — J301 Allergic rhinitis due to pollen: Secondary | ICD-10-CM | POA: Diagnosis not present

## 2022-05-08 DIAGNOSIS — J3089 Other allergic rhinitis: Secondary | ICD-10-CM | POA: Diagnosis not present

## 2022-05-08 DIAGNOSIS — J3081 Allergic rhinitis due to animal (cat) (dog) hair and dander: Secondary | ICD-10-CM | POA: Diagnosis not present

## 2022-05-16 DIAGNOSIS — J3081 Allergic rhinitis due to animal (cat) (dog) hair and dander: Secondary | ICD-10-CM | POA: Diagnosis not present

## 2022-05-16 DIAGNOSIS — J301 Allergic rhinitis due to pollen: Secondary | ICD-10-CM | POA: Diagnosis not present

## 2022-05-16 DIAGNOSIS — J3089 Other allergic rhinitis: Secondary | ICD-10-CM | POA: Diagnosis not present

## 2022-05-22 DIAGNOSIS — J3089 Other allergic rhinitis: Secondary | ICD-10-CM | POA: Diagnosis not present

## 2022-05-22 DIAGNOSIS — J3081 Allergic rhinitis due to animal (cat) (dog) hair and dander: Secondary | ICD-10-CM | POA: Diagnosis not present

## 2022-05-22 DIAGNOSIS — J301 Allergic rhinitis due to pollen: Secondary | ICD-10-CM | POA: Diagnosis not present

## 2022-05-29 DIAGNOSIS — J3089 Other allergic rhinitis: Secondary | ICD-10-CM | POA: Diagnosis not present

## 2022-05-29 DIAGNOSIS — J301 Allergic rhinitis due to pollen: Secondary | ICD-10-CM | POA: Diagnosis not present

## 2022-05-29 DIAGNOSIS — J3081 Allergic rhinitis due to animal (cat) (dog) hair and dander: Secondary | ICD-10-CM | POA: Diagnosis not present

## 2022-06-06 DIAGNOSIS — J3089 Other allergic rhinitis: Secondary | ICD-10-CM | POA: Diagnosis not present

## 2022-06-06 DIAGNOSIS — J301 Allergic rhinitis due to pollen: Secondary | ICD-10-CM | POA: Diagnosis not present

## 2022-06-06 DIAGNOSIS — J3081 Allergic rhinitis due to animal (cat) (dog) hair and dander: Secondary | ICD-10-CM | POA: Diagnosis not present

## 2022-06-27 DIAGNOSIS — I10 Essential (primary) hypertension: Secondary | ICD-10-CM | POA: Diagnosis not present

## 2022-06-27 DIAGNOSIS — Z79899 Other long term (current) drug therapy: Secondary | ICD-10-CM | POA: Diagnosis not present

## 2022-06-27 DIAGNOSIS — E785 Hyperlipidemia, unspecified: Secondary | ICD-10-CM | POA: Diagnosis not present

## 2022-06-27 DIAGNOSIS — F419 Anxiety disorder, unspecified: Secondary | ICD-10-CM | POA: Diagnosis not present

## 2022-07-04 DIAGNOSIS — E785 Hyperlipidemia, unspecified: Secondary | ICD-10-CM | POA: Diagnosis not present

## 2022-07-04 DIAGNOSIS — L509 Urticaria, unspecified: Secondary | ICD-10-CM | POA: Diagnosis not present

## 2022-07-04 DIAGNOSIS — I10 Essential (primary) hypertension: Secondary | ICD-10-CM | POA: Diagnosis not present

## 2022-07-12 DIAGNOSIS — J301 Allergic rhinitis due to pollen: Secondary | ICD-10-CM | POA: Diagnosis not present

## 2022-07-12 DIAGNOSIS — J3081 Allergic rhinitis due to animal (cat) (dog) hair and dander: Secondary | ICD-10-CM | POA: Diagnosis not present

## 2022-07-12 DIAGNOSIS — J3089 Other allergic rhinitis: Secondary | ICD-10-CM | POA: Diagnosis not present

## 2022-07-17 DIAGNOSIS — L239 Allergic contact dermatitis, unspecified cause: Secondary | ICD-10-CM | POA: Diagnosis not present

## 2022-07-19 DIAGNOSIS — J301 Allergic rhinitis due to pollen: Secondary | ICD-10-CM | POA: Diagnosis not present

## 2022-07-19 DIAGNOSIS — J3081 Allergic rhinitis due to animal (cat) (dog) hair and dander: Secondary | ICD-10-CM | POA: Diagnosis not present

## 2022-07-19 DIAGNOSIS — J3089 Other allergic rhinitis: Secondary | ICD-10-CM | POA: Diagnosis not present

## 2022-07-26 DIAGNOSIS — J301 Allergic rhinitis due to pollen: Secondary | ICD-10-CM | POA: Diagnosis not present

## 2022-07-26 DIAGNOSIS — J3081 Allergic rhinitis due to animal (cat) (dog) hair and dander: Secondary | ICD-10-CM | POA: Diagnosis not present

## 2022-07-26 DIAGNOSIS — J3089 Other allergic rhinitis: Secondary | ICD-10-CM | POA: Diagnosis not present

## 2023-04-27 ENCOUNTER — Emergency Department (HOSPITAL_COMMUNITY): Payer: PRIVATE HEALTH INSURANCE

## 2023-04-27 ENCOUNTER — Encounter (HOSPITAL_COMMUNITY): Payer: Self-pay | Admitting: *Deleted

## 2023-04-27 ENCOUNTER — Emergency Department (HOSPITAL_COMMUNITY)
Admission: EM | Admit: 2023-04-27 | Discharge: 2023-04-27 | Disposition: A | Discharge status: Home / Self Care | Payer: PRIVATE HEALTH INSURANCE | Attending: Emergency Medicine | Admitting: Emergency Medicine

## 2023-04-27 ENCOUNTER — Other Ambulatory Visit: Payer: Self-pay

## 2023-04-27 DIAGNOSIS — Z79899 Other long term (current) drug therapy: Secondary | ICD-10-CM | POA: Insufficient documentation

## 2023-04-27 DIAGNOSIS — N219 Calculus of lower urinary tract, unspecified: Secondary | ICD-10-CM | POA: Diagnosis not present

## 2023-04-27 DIAGNOSIS — I10 Essential (primary) hypertension: Secondary | ICD-10-CM | POA: Insufficient documentation

## 2023-04-27 DIAGNOSIS — R1031 Right lower quadrant pain: Secondary | ICD-10-CM | POA: Diagnosis present

## 2023-04-27 LAB — URINALYSIS, ROUTINE W REFLEX MICROSCOPIC
Bacteria, UA: NONE SEEN
Bilirubin Urine: NEGATIVE
Glucose, UA: NEGATIVE mg/dL
Ketones, ur: NEGATIVE mg/dL
Leukocytes,Ua: NEGATIVE
Nitrite: NEGATIVE
Protein, ur: NEGATIVE mg/dL
Specific Gravity, Urine: 1.017 (ref 1.005–1.030)
pH: 6 (ref 5.0–8.0)

## 2023-04-27 MED ORDER — TAMSULOSIN HCL 0.4 MG PO CAPS: 0.4000 mg | ORAL_CAPSULE | Freq: Every day | ORAL | 0 refills | Status: AC

## 2023-04-27 MED ORDER — OXYCODONE-ACETAMINOPHEN 5-325 MG PREPACK: ORAL_TABLET | ORAL | 0 refills | Status: DC

## 2023-04-27 MED ORDER — OXYCODONE-ACETAMINOPHEN 5-325 MG PO TABS: 1.0000 | ORAL_TABLET | ORAL | 0 refills | Status: AC | PRN

## 2023-04-27 MED ORDER — HYDROMORPHONE HCL 1 MG/ML IJ SOLN
2.0000 mg | Freq: Once | INTRAMUSCULAR | Status: AC
  Administered 2023-04-27: 2 mg via INTRAMUSCULAR
  Filled 2023-04-27: qty 2

## 2023-04-27 NOTE — ED Triage Notes (Signed)
Pt with right lower back pain starting today after hammering something earlier today.  Pt with hx of kidney stone.  Pt with hx of back surgery.

## 2023-04-27 NOTE — ED Provider Triage Note (Signed)
Emergency Medicine Provider Triage Evaluation Note  Logan Solis , a 57 y.o. male  was evaluated in triage.  Pt complains of severe back pain.  Pt on the floor at triage. Pt has a histroy of back surgery    Review of Systems  Positive: Back pain Negative: fever  Physical Exam  BP 123/80 (BP Location: Right Arm)   Pulse 75   Temp 98.7 F (37.1 C) (Oral)   Resp 18   Ht 5\' 8"  (1.727 m)   Wt 95.3 kg   SpO2 98%   BMI 31.93 kg/m  Gen:   Awake, uncomfortable  Resp:  Normal effort  MSK:   Moves extremities  Other:    Medical Decision Making  Medically screening exam initiated at 4:32 PM.  Appropriate orders placed.  Logan Solis was informed that the remainder of the evaluation will be completed by another provider, this initial triage assessment does not replace that evaluation, and the importance of remaining in the ED until their evaluation is complete.  Very limited assessment.  Pain medication ordered IM  I suspect kidney stone, Ct renal ordered    Elson Areas, PA-C 04/27/23 1634

## 2023-04-27 NOTE — Discharge Instructions (Addendum)
Return if any problems.

## 2023-04-28 NOTE — ED Provider Notes (Signed)
[place] EMERGENCY DEPARTMENT AT [facility] Provider Note   CSN: [id number] Arrival date & time: 04/27/23  1527     History  Chief Complaint  Patient presents with   Back Pain    Logan Solis is a 57 y.o. male.  Patient complains of onset of right lower quadrant abdominal pain.  Patient reports he has had kidney stones in the past.  Patient reports he has also had back problems.  Patient reports this pain came on suddenly and is severe.  Patient denies any injury.  Patient denies any fever or chills he has not had any burning with urination.  Has a past medical history of hypertension  The history is provided by the patient. No language interpreter was used.  Flank Pain This is a new problem. The current episode started 1 to 2 hours ago. The problem occurs constantly. The problem has been gradually worsening. Associated symptoms include abdominal pain. Nothing aggravates the symptoms. Nothing relieves the symptoms. He has tried nothing for the symptoms.       Home Medications Prior to Admission medications   Medication Sig Start Date End Date Taking? Authorizing Provider  oxyCODONE-acetaminophen (PERCOCET) 5-325 MG tablet Take 1 tablet by mouth every 4 (four) hours as needed for severe pain. 04/27/23 04/26/24 Yes Elson Areas, PA-C  oxyCODONE-acetaminophen (PERCOCET) 5-325 mg TABS tablet One tablet every 4 hours as needed 04/27/23  Yes Elson Areas, PA-C  tamsulosin (FLOMAX) 0.4 MG CAPS capsule Take 1 capsule (0.4 mg total) by mouth daily. 04/27/23  Yes Cheron Schaumann K, PA-C  amLODipine (NORVASC) 10 MG tablet Take 10 mg by mouth daily.    [provider]  AUVI-Q 0.3 MG/0.3ML SOAJ injection Inject into the muscle as directed. 05/15/21   [provider]  Azelastine-Fluticasone (DYMISTA NA) Place into the nose.    [provider]  citalopram (CELEXA) 20 MG tablet Take 20 mg by mouth daily.    [provider]  levocetirizine  (XYZAL) 5 MG tablet Take 5 mg by mouth every evening.    [provider]  losartan-hydrochlorothiazide (HYZAAR) 100-25 MG tablet Take 1 tablet by mouth daily.    [provider]  rosuvastatin (CRESTOR) 20 MG tablet Take 20 mg by mouth at bedtime. 05/29/21   [provider]      Allergies    Patient has no known allergies.    Review of Systems   Review of Systems  Gastrointestinal:  Positive for abdominal pain.  Genitourinary:  Positive for flank pain.  All other systems reviewed and are negative.   Physical Exam Updated Vital Signs BP 100/60 (BP Location: Right Arm)   Pulse 70   Temp 98.6 F (37 C) (Oral)   Resp 18   Ht 5\' 8"  (1.727 m)   Wt 95.3 kg   SpO2 99%   BMI 31.93 kg/m  Physical Exam Vitals reviewed.  Constitutional:      Appearance: Normal appearance.  HENT:     Head: Normocephalic.     Nose: Nose normal.     Mouth/Throat:     Mouth: Mucous membranes are moist.  Cardiovascular:     Rate and Rhythm: Normal rate and regular rhythm.  Pulmonary:     Effort: Pulmonary effort is normal.     Breath sounds: Normal breath sounds.  Abdominal:     General: Abdomen is flat.     Tenderness: There is right CVA tenderness.  Musculoskeletal:  General: Normal range of motion.  Skin:    General: Skin is warm.  Neurological:     General: No focal deficit present.     Mental Status: He is alert.  Psychiatric:        Mood and Affect: Mood normal.     ED Results / Procedures / Treatments   Labs (all labs ordered are listed, but only abnormal results are displayed) Labs Reviewed  URINALYSIS, ROUTINE W REFLEX MICROSCOPIC - Abnormal; Notable for the following components:      Result Value   Hgb urine dipstick MODERATE (*)    All other components within normal limits    EKG None  Radiology CT Renal Stone Study  Result Date: 04/27/2023 CLINICAL DATA:  Abdominal/flank pain, stone suspected. Right lower back pain after hammering  something earlier today. History of kidney stones. EXAM: CT ABDOMEN AND PELVIS WITHOUT CONTRAST TECHNIQUE: Multidetector CT imaging of the abdomen and pelvis was performed following the standard protocol without IV contrast. RADIATION DOSE REDUCTION: This exam was performed according to the departmental dose-optimization program which includes automated exposure control, adjustment of the mA and/or kV according to patient size and/or use of iterative reconstruction technique. COMPARISON:  None Available. FINDINGS: Lower chest: Clear lung bases. No significant pleural or pericardial effusion. Hepatobiliary: Well-circumscribed low-density left hepatic lesions measuring up to 1.5 cm on image 14/2 are likely cysts. No suspicious liver lesions are identified. Small dependent calcified gallstones without gallbladder wall thickening, surrounding inflammation or biliary ductal dilatation. Pancreas: Unremarkable. No pancreatic ductal dilatation or surrounding inflammatory changes. Spleen: Normal in size without focal abnormality. Adrenals/Urinary Tract: Both adrenal glands appear normal. There is an obstructing 2 mm calculus in the distal right ureter, just above the ureterovesical junction. Associated moderate right-sided hydronephrosis and hydroureter. 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 There is mild asymmetric perinephric soft tissue stranding on the right. No other urinary tract calculi are identified. There is no left-sided hydronephrosis. The bladder appears unremarkable for its degree of distention. Stomach/Bowel: No enteric contrast administered. The stomach appears unremarkable for its degree of distension. No evidence of bowel wall thickening, distention or surrounding inflammatory change. The appendix appears normal. Minimal distal colonic diverticulosis. Vascular/Lymphatic: There are no enlarged abdominal or pelvic lymph nodes. Mild aortoiliac atherosclerosis without evidence of aneurysm.  Reproductive: The prostate gland and seminal vesicles appear unremarkable. Other: No evidence of abdominal wall mass or hernia. No ascites or pneumoperitoneum. Musculoskeletal: No acute or significant osseous findings. Mild multilevel spondylosis. There are nondisplaced chronic bilateral L5 pars defects without resulting anterolisthesis or significant foraminal narrowing at L5-S1. Mild spinal stenosis and asymmetric right foraminal narrowing at L3-4. Unless specific follow-up recommendations are mentioned in the findings or impression sections, no imaging follow-up of any mentioned incidental findings is recommended. IMPRESSION: 1. Obstructing 2 mm calculus in the distal right ureter with associated moderate right-sided hydronephrosis and hydroureter. 2. No other urinary tract calculi identified. 3. Cholelithiasis without evidence of cholecystitis or biliary ductal dilatation. 4. 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 5.  Aortic Atherosclerosis (ICD10-I70.0). Electronically Signed   By: Carey Bullocks M.D.   On: 04/27/2023 17:17    Procedures Procedures    Medications Ordered in ED Medications  HYDROmorphone (DILAUDID) injection 2 mg (2 mg Intramuscular Given 04/27/23 1704)    ED Course/ Medical Decision Making/ A&P  Medical Decision Making Patient complains of sudden onset of severe right flank pain  Amount and/or Complexity of Data Reviewed Independent Historian: spouse    Details: Patient is here with his wife who is supportive External Data Reviewed: notes.    Details: Patient's previous notes reviewed.  History of discectomy by Dr. Channing Mutters Labs: ordered. Decision-making details documented in ED Course.    Details: Labs ordered reviewed and interpreted urinalysis shows blood Radiology: ordered and independent interpretation performed. Decision-making details documented in ED Course.    Details: CT renal shows 2 mm right distal  ureteral calculus.  Stones noted  Risk Prescription drug management. Risk Details: Patient given injection of Dilaudid for pain.  Patient reports decreased pain.  Patient is counseled on results of CT scan patient is advised to follow-up with urology.  Patient also advised to follow-up with primary care physician.  Patient is given a oxycodone prepack here.  He is given a prescription for oxycodone and Flomax.  Patient is advised to return to the emergency department if symptoms worsen or change           Final Clinical Impression(s) / ED Diagnoses Final diagnoses:  Calculus of lower urinary tract    Rx / DC Orders ED Discharge Orders          Ordered    oxyCODONE-acetaminophen (PERCOCET) 5-325 MG tablet  Every 4 hours PRN        04/27/23 1759    tamsulosin (FLOMAX) 0.4 MG CAPS capsule  Daily        04/27/23 1759    oxyCODONE-acetaminophen (PERCOCET) 5-325 mg TABS tablet        04/27/23 1801           An After Visit Summary was printed and given to the patient.    Elson Areas, PA-C 04/28/23 1559    Royanne Foots, DO 04/30/23 1427

## 2023-04-29 ENCOUNTER — Ambulatory Visit (INDEPENDENT_AMBULATORY_CARE_PROVIDER_SITE_OTHER): Payer: PRIVATE HEALTH INSURANCE | Admitting: Urology

## 2023-04-29 ENCOUNTER — Ambulatory Visit: Payer: PRIVATE HEALTH INSURANCE | Admitting: Urology

## 2023-04-29 ENCOUNTER — Encounter: Payer: Self-pay | Admitting: Urology

## 2023-04-29 VITALS — BP 111/67 | HR 71

## 2023-04-29 DIAGNOSIS — N201 Calculus of ureter: Secondary | ICD-10-CM

## 2023-04-29 LAB — MICROSCOPIC EXAMINATION
Bacteria, UA: NONE SEEN
WBC, UA: NONE SEEN /[HPF] (ref 0–5)

## 2023-04-29 LAB — URINALYSIS, ROUTINE W REFLEX MICROSCOPIC
Bilirubin, UA: NEGATIVE
Glucose, UA: NEGATIVE
Ketones, UA: NEGATIVE
Leukocytes,UA: NEGATIVE
Nitrite, UA: NEGATIVE
Protein,UA: NEGATIVE
Specific Gravity, UA: 1.03 (ref 1.005–1.030)
Urobilinogen, Ur: 0.2 mg/dL (ref 0.2–1.0)
pH, UA: 5.5 (ref 5.0–7.5)

## 2023-04-29 MED ORDER — KETOROLAC TROMETHAMINE 60 MG/2ML IM SOLN
60.0000 mg | Freq: Once | INTRAMUSCULAR | Status: AC
  Administered 2023-04-29: 60 mg via INTRAMUSCULAR

## 2023-04-29 MED FILL — Oxycodone w/ Acetaminophen Tab 5-325 MG: ORAL | Qty: 6 | Status: AC

## 2023-04-29 NOTE — Progress Notes (Signed)
H&P  Chief Complaint: Kidney stone  History of Present Illness: 57 year old male comes in today, 2 days after emergency room presentation for a symptomatic right ureteral stone.  This is his first kidney stone.  He has had a prior CT scan several years ago for hematuria.  He states that he has "2 drainage tubes" on his right kidney.  Currently, still having 10/10 pain.  Has been given Percocet but this does not seem to help.  There is no associated fever or chills but he does have nausea.  No vomiting for the past day or so.  Past Medical History:  Diagnosis Date   Hypertension     Past Surgical History:  Procedure Laterality Date   BACK SURGERY     SHOULDER SURGERY      Home Medications:  Allergies as of 04/29/2023   No Known Allergies      Medication List        Accurate as of April 29, 2023  9:06 AM. If you have any questions, ask your nurse or doctor.          amLODipine 10 MG tablet Commonly known as: NORVASC Take 10 mg by mouth daily.   Auvi-Q 0.3 MG/0.3ML Soaj injection Generic drug: EPINEPHrine Inject into the muscle as directed.   citalopram 20 MG tablet Commonly known as: CELEXA Take 20 mg by mouth daily.   DYMISTA NA Place into the nose.   levocetirizine 5 MG tablet Commonly known as: XYZAL Take 5 mg by mouth every evening.   losartan-hydrochlorothiazide 100-25 MG tablet Commonly known as: HYZAAR Take 1 tablet by mouth daily.   oxyCODONE-acetaminophen 5-325 MG tablet Commonly known as: Percocet Take 1 tablet by mouth every 4 (four) hours as needed for severe pain.   oxyCODONE-acetaminophen 5-325 mg Tabs tablet Commonly known as: PERCOCET One tablet every 4 hours as needed   rosuvastatin 20 MG tablet Commonly known as: CRESTOR Take 20 mg by mouth at bedtime.   tamsulosin 0.4 MG Caps capsule Commonly known as: FLOMAX Take 1 capsule (0.4 mg total) by mouth daily.        Allergies: No Known Allergies  Family History  Problem  Relation Age of Onset   Healthy Mother    Healthy Father     Social History:  reports that he has never smoked. He has never used smokeless tobacco. He reports that he does not drink alcohol and does not use drugs.  ROS: A complete review of systems was performed.  All systems are negative except for pertinent findings as noted.  Physical Exam:  Vital signs in last 24 hours: There were no vitals taken for this visit. Constitutional:  Alert and oriented, in pain Cardiovascular: Regular rate  Respiratory: Normal respiratory effort Neurologic: Grossly intact, no focal deficits Psychiatric: Normal mood and affect   I have reviewed notes from referring/previous physicians--emergency room visit  I have reviewed urinalysis results  I have independently reviewed prior imaging--CT images reviewed.  Findings: 1. Obstructing 2 mm calculus in the distal right ureter with associated moderate right-sided   hydronephrosis and hydroureter. 2. No other urinary tract calculi identified. 3. Cholelithiasis without evidence of cholecystitis or biliary ductal dilatation. 4. 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 5.  Aortic Atherosclerosis (ICD10-I70.0).  I have reviewed prior PSA results  I have reviewed prior urine culture   Impression/Assessment:  2 mm right distal ureteral stone with persistent pain.  No other complicating factors, however  Plan:  Patient was  given Toradol 60 mg IM  I strongly suggested that with his 2 mm stone he continue MET, with the fact that he already has oxycodone  I told him that he could mix the oxycodone and Toradol or ibuprofen  I will have him come in in 1 week to follow-up with Evette Georges, NP

## 2023-04-29 NOTE — Progress Notes (Signed)
IM injection  Medication: Ketorolac Tromethamine Dose: 60 Location: Left Glutea Lot: [id number] Exp: [id number]  Patient tolerated well, no complications were noted  Performed by: Kennyth Lose, CMA

## 2023-05-01 NOTE — Progress Notes (Signed)
History of Present Illness: Here to follow-up a kidney stone.  Presented last week 2 days after emergency room presentation for right distal ureteral stone, 2 mm in size.  Treated with Toradol, and he had already been started on MET.  10.7.2024: He feels as if he passed his stone last Wed. No pain since then.  Past Medical History:  Diagnosis Date   Hypertension     Past Surgical History:  Procedure Laterality Date   BACK SURGERY     SHOULDER SURGERY      Home Medications:  Allergies as of 05/06/2023   No Known Allergies      Medication List        Accurate as of May 01, 2023  1:12 PM. If you have any questions, ask your nurse or doctor.          amLODipine 10 MG tablet Commonly known as: NORVASC Take 10 mg by mouth daily.   Auvi-Q 0.3 MG/0.3ML Soaj injection Generic drug: EPINEPHrine Inject into the muscle as directed.   citalopram 20 MG tablet Commonly known as: CELEXA Take 20 mg by mouth daily.   DYMISTA NA Place into the nose.   levocetirizine 5 MG tablet Commonly known as: XYZAL Take 5 mg by mouth every evening.   losartan-hydrochlorothiazide 100-25 MG tablet Commonly known as: HYZAAR Take 1 tablet by mouth daily.   oxyCODONE-acetaminophen 5-325 MG tablet Commonly known as: Percocet Take 1 tablet by mouth every 4 (four) hours as needed for severe pain.   oxyCODONE-acetaminophen 5-325 mg Tabs tablet Commonly known as: PERCOCET One tablet every 4 hours as needed   rosuvastatin 20 MG tablet Commonly known as: CRESTOR Take 20 mg by mouth at bedtime.   tamsulosin 0.4 MG Caps capsule Commonly known as: FLOMAX Take 1 capsule (0.4 mg total) by mouth daily.        Allergies: No Known Allergies  Family History  Problem Relation Age of Onset   Healthy Mother    Healthy Father     Social History:  reports that he has never smoked. He has never used smokeless tobacco. He reports that he does not drink alcohol and does not use  drugs.  ROS: A complete review of systems was performed.  All systems are negative except for pertinent findings as noted.  Physical Exam:  Vital signs in last 24 hours: There were no vitals taken for this visit. Constitutional:  Alert and oriented, No acute distress Cardiovascular: Regular rate  Respiratory: Normal respiratory effort Neurologic: Grossly intact, no focal deficits Psychiatric: Normal mood and affect  I have reviewed prior pt notes  I have reviewed urinalysis results  I have independently reviewed prior imaging--KUB--no stone seen     Impression/Assessment:  Passed Rt ureteral stone  Plan:  Stone prevention diet discussed  OV prn

## 2023-05-02 ENCOUNTER — Telehealth: Payer: Self-pay

## 2023-05-02 NOTE — Telephone Encounter (Signed)
Patient called again he said that he thinks he passed something, it looked like coffee grounds and it was soft and squishy .

## 2023-05-02 NOTE — Telephone Encounter (Addendum)
I will look for an open slot following another case.  Please provide posting sheet when you can

## 2023-05-02 NOTE — Telephone Encounter (Signed)
Return called to patient. Patient states that he pass something that look like coffee ground and was in 10/10 pain on yesterday but patient states that he is not having any pain today  Patient wants to have additional imaging to determine if the stone is still presented. Patient is aware a message will be sent to Dr. Retta Diones on advisement. Patient voiced understanding

## 2023-05-02 NOTE — Telephone Encounter (Signed)
Patient states he is having a lot of pain with his stone.  He would like to order more imaging and move forward with surgery.  Do you need another x ray or just schedule patient for surgery?

## 2023-05-03 ENCOUNTER — Encounter (HOSPITAL_BASED_OUTPATIENT_CLINIC_OR_DEPARTMENT_OTHER): Payer: Self-pay | Admitting: Urology

## 2023-05-03 ENCOUNTER — Telehealth: Payer: Self-pay

## 2023-05-03 NOTE — Telephone Encounter (Signed)
Please see below.  Ok to order KUB?

## 2023-05-03 NOTE — Telephone Encounter (Signed)
Patient is wanting to know where the stone is. He would like imaging done before his surgery on October 14.  Has not been hurting for the past couple of days.

## 2023-05-06 ENCOUNTER — Ambulatory Visit (HOSPITAL_COMMUNITY)
Admission: RE | Admit: 2023-05-06 | Discharge: 2023-05-06 | Disposition: A | Discharge status: Home / Self Care | Payer: PRIVATE HEALTH INSURANCE | Source: Ambulatory Visit | Attending: Urology | Admitting: Urology

## 2023-05-06 ENCOUNTER — Other Ambulatory Visit: Payer: Self-pay

## 2023-05-06 ENCOUNTER — Encounter: Payer: Self-pay | Admitting: Urology

## 2023-05-06 ENCOUNTER — Ambulatory Visit (INDEPENDENT_AMBULATORY_CARE_PROVIDER_SITE_OTHER): Payer: PRIVATE HEALTH INSURANCE | Admitting: Urology

## 2023-05-06 VITALS — BP 110/69 | HR 92

## 2023-05-06 DIAGNOSIS — N201 Calculus of ureter: Secondary | ICD-10-CM | POA: Insufficient documentation

## 2023-05-06 DIAGNOSIS — Z09 Encounter for follow-up examination after completed treatment for conditions other than malignant neoplasm: Secondary | ICD-10-CM | POA: Diagnosis not present

## 2023-05-06 NOTE — Telephone Encounter (Signed)
Order in, pt aware to have KUB prior to appt

## 2023-05-13 ENCOUNTER — Ambulatory Visit (HOSPITAL_BASED_OUTPATIENT_CLINIC_OR_DEPARTMENT_OTHER): Admission: RE | Admit: 2023-05-13 | Payer: PRIVATE HEALTH INSURANCE | Source: Home / Self Care | Admitting: Urology

## 2023-05-13 HISTORY — DX: Hyperlipidemia, unspecified: E78.5

## 2023-05-13 HISTORY — DX: Calculus of ureter: N20.1

## 2023-05-13 SURGERY — CYSTOURETEROSCOPY, WITH RETROGRADE PYELOGRAM AND STENT INSERTION
Anesthesia: General | Laterality: Right

## 2023-07-04 IMAGING — CT CT CARDIAC CORONARY ARTERY CALCIUM SCORE
1 of 2 series · 6 of 20 positions shown, 8 images · non-contrast
Comparison: None.
COMPARISON: None.

Addendum:
EXAM:
OVER-READ INTERPRETATION  CT CHEST

The following report is an over-read performed by radiologist Dr.
Kishwer Mae [REDACTED] on 05/25/2021. This
over-read does not include interpretation of cardiac or coronary
anatomy or pathology. The coronary calcium score interpretation by
the cardiologist is attached.
CLINICAL DATA: Cardiovascular Disease Risk stratification
Coronary Calcium Score
TECHNIQUE: A gated, non-contrast computed tomography scan of the heart was
performed using 3mm slice thickness. Axial images were analyzed on a
dedicated workstation. Calcium scoring of the coronary arteries was
performed using the Agatston method.

[Series 2: cascseq 3.0 b35f 70% · axial · 0.84mm/px · z∈[+1263,+1356]mm · 6 of 45 slices shown, 8 images]
[im 7/45  vessel]
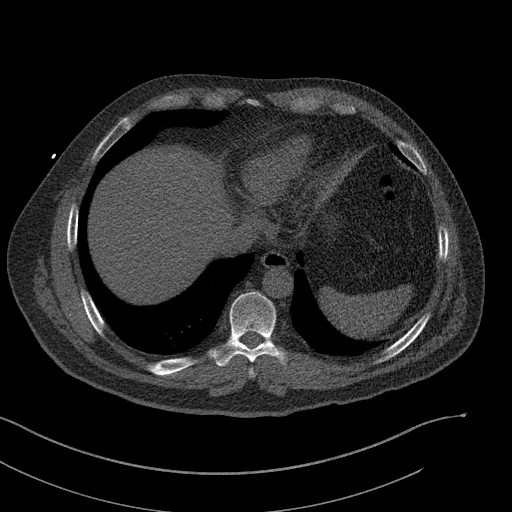
[im 7/45  lung]
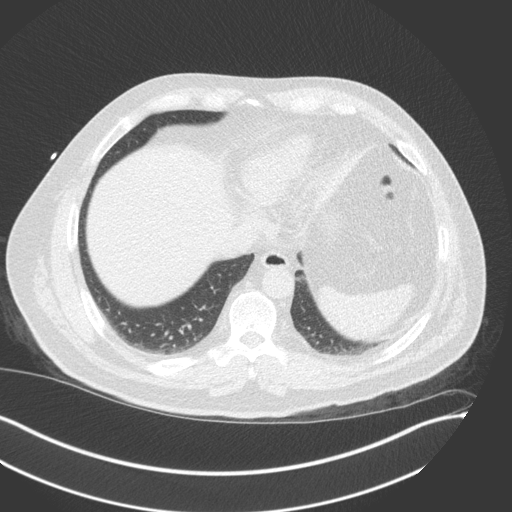
[im 13/45  vessel]
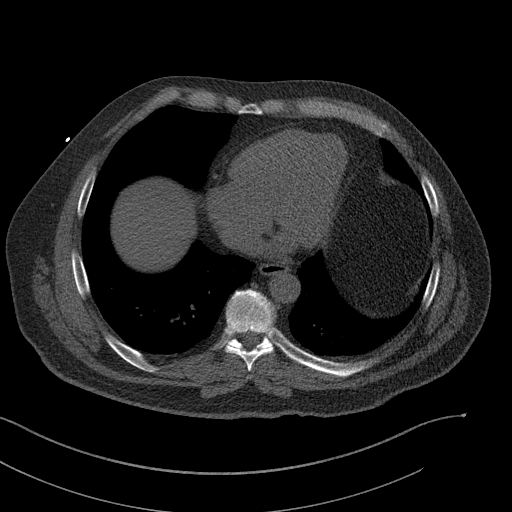
[im 19/45  vessel]
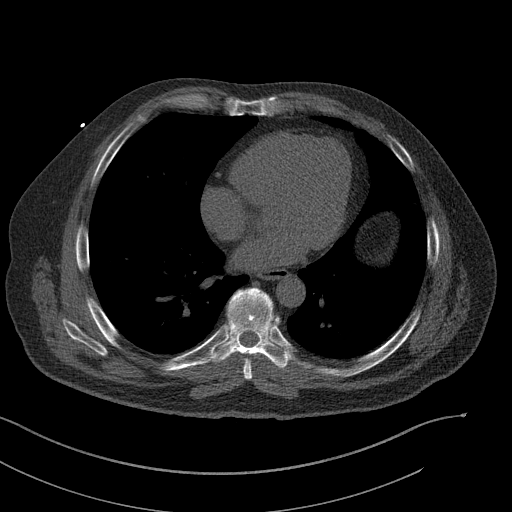
[im 26/45  vessel]
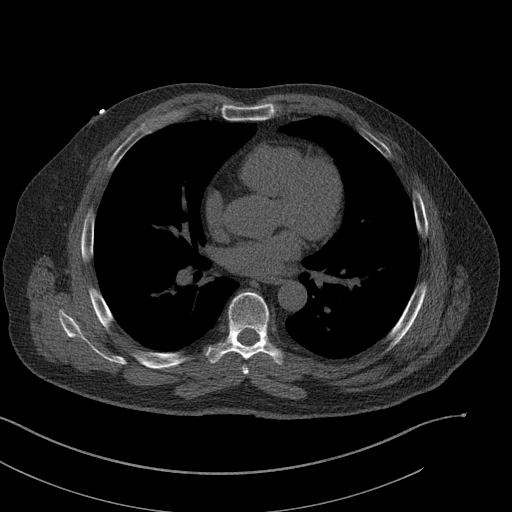
[im 32/45  vessel]
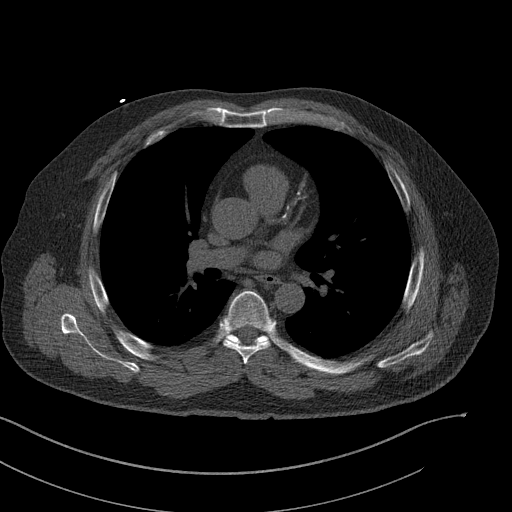
[im 32/45  lung]
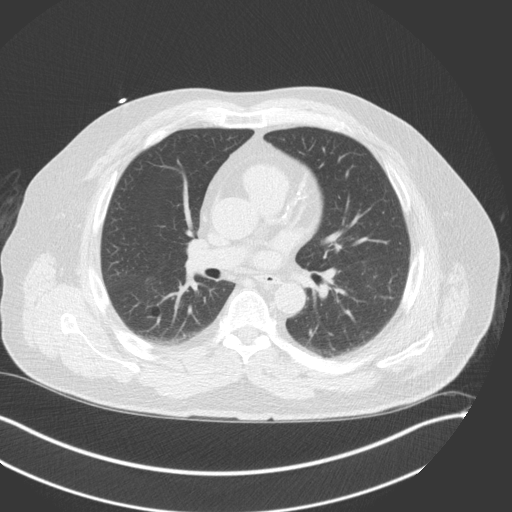
[im 38/45  vessel]
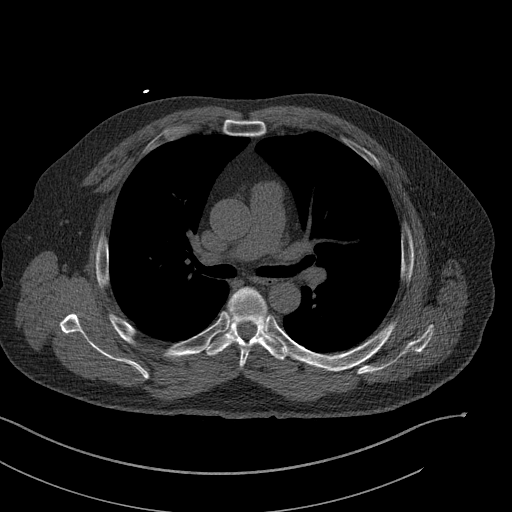

[6 of 20 positions shown; findings below may reference images not displayed]

FINDINGS: Vascular: No significant noncardiac vascular findings.

Mediastinum/Nodes: Visualized mediastinum and hilar regions
demonstrate no lymphadenopathy or masses.

Lungs/Pleura: Visualized lungs show no evidence of pulmonary edema,
consolidation, pneumothorax, nodule or pleural fluid.

Upper Abdomen: No acute abnormality.

Musculoskeletal: No chest wall mass or suspicious bone lesions
identified.
IMPRESSION: No significant incidental findings.
FINDINGS: Coronary Calcium Score:

Left main: 0

Left anterior descending artery: 203

Left circumflex artery: 0

Right coronary artery: 0

Total: 203

Percentile: 85th

Pericardium: Normal.

Non-cardiac: See separate report from [REDACTED].
IMPRESSION: Coronary calcium score of 203. This was 85th percentile for age-,
race-, and sex-matched controls.



If CAC=0, it is reasonable to withhold statin therapy and reassess
in 5 to 10 years, as long as higher risk conditions are absent
(diabetes mellitus, family history of premature CHD in first degree
relatives (males <55 years; females <65 years), cigarette smoking,
or LDL >=190 mg/dL).

If CAC is 1 to 99, it is reasonable to initiate statin therapy for
patients >=55 years of age.

If CAC is >=100 or >=75th percentile, it is reasonable to initiate
statin therapy at any age.

Cardiology referral should be considered for patients with CAC
scores >=400 or >=75th percentile.

*6830 AHA/ACC/AACVPR/AAPA/ABC/ZAHIDAH/RENOLD/GISEL/Chimirri/MLADEN/NASH N SATEEANAH/HARUNA
Guideline on the Management of Blood Cholesterol: A Report of the
American College of Cardiology/American Heart Association Task Force
on Clinical Practice Guidelines. J Am Coll Cardiol.
1264;73(24):2900-2177.

*** End of Addendum ***
EXAM:
OVER-READ INTERPRETATION  CT CHEST

The following report is an over-read performed by radiologist Dr.
Kishwer Mae [REDACTED] on 05/25/2021. This
over-read does not include interpretation of cardiac or coronary
anatomy or pathology. The coronary calcium score interpretation by
the cardiologist is attached.
FINDINGS: Vascular: No significant noncardiac vascular findings.

Mediastinum/Nodes: Visualized mediastinum and hilar regions
demonstrate no lymphadenopathy or masses.

Lungs/Pleura: Visualized lungs show no evidence of pulmonary edema,
consolidation, pneumothorax, nodule or pleural fluid.

Upper Abdomen: No acute abnormality.

Musculoskeletal: No chest wall mass or suspicious bone lesions
identified.
IMPRESSION: No significant incidental findings.

## 2023-12-17 ENCOUNTER — Institutional Professional Consult (permissible substitution) (INDEPENDENT_AMBULATORY_CARE_PROVIDER_SITE_OTHER): Payer: PRIVATE HEALTH INSURANCE | Admitting: Otolaryngology

## 2023-12-17 ENCOUNTER — Ambulatory Visit (INDEPENDENT_AMBULATORY_CARE_PROVIDER_SITE_OTHER): Payer: PRIVATE HEALTH INSURANCE | Admitting: Audiology

## 2023-12-17 DIAGNOSIS — H903 Sensorineural hearing loss, bilateral: Secondary | ICD-10-CM

## 2023-12-17 NOTE — Progress Notes (Signed)
  [address], [address] [place], [place] [address] [phone]  Audiological Evaluation    Name: Logan Solis     DOB:   February 23, 1966      MRN:   [id number]                                                                                     Service Date: 12/17/2023     Accompanied by: unaccompanied   Patient comes today after Dr. Darlin Ehrlich, ENT sent a referral for a hearing evaluation due to concerns with dizziness.   Symptoms Yes Details  Hearing loss  []  Per wife  Tinnitus  []    Ear pain/ infections/pressure  []    Balance problems  [x]  Reportedly years ago had vertigo and its presentation seems similar to BPPV's.  Noise exposure history  []    Previous ear surgeries  []    Family history of hearing loss  []    Amplification  []    Other  []      Otoscopy: Right ear: Clear external ear canals and notable landmarks visualized on the tympanic membrane. Left ear:  Clear external ear canals and notable landmarks visualized on the tympanic membrane.  Tympanometry: Right ear: Type A- Normal external ear canal volume with normal middle ear pressure and tympanic membrane compliance. Left ear: Type A- Normal external ear canal volume with normal middle ear pressure and tympanic membrane compliance.     Pure tone Audiometry: Both ears- Normal hearing from 270-335-0305 Hz, then mild  sensorineural hearing loss from 6000 Hz - 8000 Hz.    Speech Audiometry: Right ear- Speech Reception Threshold (SRT) was obtained at 10 dBHL. Left ear-Speech Reception Threshold (SRT) was obtained at 10 dBHL.   Word Recognition Score Tested using NU-6 (MLV) Right ear: 100% was obtained at a presentation level of 50 dBHL with contralateral masking which is deemed as  excellent. Left ear: 96% was obtained at a presentation level of 50 dBHL with contralateral masking which is deemed as  excellent.   The hearing test results were completed under headphones and results are deemed to be of good  reliability. Test technique:  conventional      Recommendations: Follow up with ENT as scheduled for today. Return for a hearing evaluation if concerns with hearing changes arise or per MD recommendation.   Freddrick Gladson Logan Solis, AUD

## 2023-12-24 ENCOUNTER — Encounter: Payer: Self-pay | Admitting: Audiology

## 2023-12-25 ENCOUNTER — Ambulatory Visit (INDEPENDENT_AMBULATORY_CARE_PROVIDER_SITE_OTHER): Admitting: Otolaryngology

## 2023-12-25 ENCOUNTER — Encounter (INDEPENDENT_AMBULATORY_CARE_PROVIDER_SITE_OTHER): Payer: Self-pay | Admitting: Otolaryngology

## 2023-12-25 VITALS — BP 132/84 | HR 77 | Ht 69.0 in | Wt 215.0 lb

## 2023-12-25 DIAGNOSIS — H903 Sensorineural hearing loss, bilateral: Secondary | ICD-10-CM | POA: Insufficient documentation

## 2023-12-25 NOTE — Progress Notes (Signed)
 CC: Progressive hearing loss  HPI:  Logan Solis is a 58 y.o. male who presents today complaining of bilateral progressive hearing loss for many years.  His wife has been complaining for years that his hearing has worsened.  He has never worn hearing aids.  He denies any otalgia, otorrhea, or vertigo.  He has no previous otologic surgery.  He had a vocal cord polyp removed by Dr. Sean Solis.  Past Medical History:  Diagnosis Date   Hyperlipidemia    Hypertension    Right ureteral stone     Past Surgical History:  Procedure Laterality Date   BACK SURGERY     SHOULDER SURGERY      Family History  Problem Relation Age of Onset   Healthy Mother    Healthy Father     Social History:  reports that he has never smoked. He has never used smokeless tobacco. He reports that he does not drink alcohol and does not use drugs.  Allergies: No Known Allergies  Prior to Admission medications   Medication Sig Start Date End Date Taking? Authorizing Provider  amLODipine (NORVASC) 10 MG tablet Take 10 mg by mouth daily.   Yes [provider]  AUVI-Q 0.3 MG/0.3ML SOAJ injection Inject into the muscle as directed. 05/15/21  Yes [provider]  Azelastine-Fluticasone (DYMISTA NA) Place into the nose.   Yes [provider]  citalopram (CELEXA) 20 MG tablet Take 20 mg by mouth daily.   Yes [provider]  levocetirizine (XYZAL) 5 MG tablet Take 5 mg by mouth every evening.   Yes [provider]  losartan-hydrochlorothiazide (HYZAAR) 100-25 MG tablet Take 1 tablet by mouth daily.   Yes [provider]  oxyCODONE -acetaminophen  (PERCOCET) 5-325 MG tablet Take 1 tablet by mouth every 4 (four) hours as needed for severe pain. 04/27/23 04/26/24 Yes Logan Solis, Logan K, PA-C  rosuvastatin (CRESTOR) 20 MG tablet Take 20 mg by mouth at bedtime. 05/29/21  Yes [provider]  tamsulosin  (FLOMAX ) 0.4 MG CAPS capsule Take 1 capsule (0.4 mg  total) by mouth daily. 04/27/23  Yes Logan Crosby, PA-C    Blood pressure 132/84, pulse 77, height 5\' 9"  (1.753 m), weight 215 lb (97.5 kg), SpO2 97%. Exam: General: Communicates without difficulty, well nourished, no acute distress. Head: Normocephalic, no evidence injury, no tenderness, facial buttresses intact without stepoff. Face/sinus: No tenderness to palpation and percussion. Facial movement is normal and symmetric. Eyes: PERRL, EOMI. No scleral icterus, conjunctivae clear. Neuro: CN II exam reveals vision grossly intact.  No nystagmus at any point of gaze. Ears: Auricles well formed without lesions.  Ear canals are intact without mass or lesion.  No erythema or edema is appreciated.  The TMs are intact without fluid. Nose: External evaluation reveals normal support and skin without lesions.  Dorsum is intact.  Anterior rhinoscopy reveals normal mucosa over anterior aspect of inferior turbinates and intact septum.  No purulence noted. Oral:  Oral cavity and oropharynx are intact, symmetric, without erythema or edema.  Mucosa is moist without lesions. Neck: Full range of motion without pain.  There is no significant lymphadenopathy.  No masses palpable.  Thyroid bed within normal limits to palpation.  Parotid glands and submandibular glands equal bilaterally without mass.  Trachea is midline. Neuro:  CN 2-12 grossly intact.   His hearing test shows bilateral symmetric mild high-frequency sensorineural hearing loss.  Assessment: 1.  Bilateral mild high-frequency sensorineural hearing loss, likely secondary to routine presbycusis. 2.  His ear canals, tympanic membranes, and middle ear spaces are normal.  Plan: 1.  The physical exam findings and the hearing test results are reviewed with the patient. 2.  The patient is a candidate for hearing amplification.  The hearing aid options are discussed. 3.  The patient will return for reevaluation in 1 year, sooner if needed.  Logan Solis Logan Solis 12/25/2023,  5:30 PM

## 2024-03-12 ENCOUNTER — Telehealth: Payer: Self-pay | Admitting: Urology

## 2024-03-12 NOTE — Telephone Encounter (Signed)
 Patient is a [facility] patient. Called our office to be scheduled with you here sooner. He states he has blood in his semen. Wants to know if that was due to his ED medication or something else.  Patient is scheduled with you here on 09/08 at 2:15pm

## 2024-03-13 NOTE — Telephone Encounter (Signed)
 Pt called and given MD Dahlstedt recommendation pt voiced his understanding

## 2024-04-05 NOTE — Progress Notes (Signed)
 History of Present Illness:   10.7.2024: Here to follow-up a kidney stone.  Presented last week 2 days after emergency room presentation for right distal ureteral stone, 2 mm in size.  Treated with Toradol , and he had already been started on MET. He felt as if he had passed stone.  05-03-2024: No stones since his last visit.  He noted hematospermia once recently.  Not associated with lower urinary tract symptoms.  No repeated episodes. Past Medical History:  Diagnosis Date   Hyperlipidemia    Hypertension    Right ureteral stone     Past Surgical History:  Procedure Laterality Date   BACK SURGERY     SHOULDER SURGERY      Home Medications:  Allergies as of 03-May-2024   No Known Allergies      Medication List        Accurate as of April 05, 2024  8:52 PM. If you have any questions, ask your nurse or doctor.          amLODipine 10 MG tablet Commonly known as: NORVASC Take 10 mg by mouth daily.   Auvi-Q 0.3 MG/0.3ML Soaj injection Generic drug: EPINEPHrine Inject into the muscle as directed.   citalopram 20 MG tablet Commonly known as: CELEXA Take 20 mg by mouth daily.   DYMISTA NA Place into the nose.   levocetirizine 5 MG tablet Commonly known as: XYZAL Take 5 mg by mouth every evening.   losartan-hydrochlorothiazide 100-25 MG tablet Commonly known as: HYZAAR Take 1 tablet by mouth daily.   oxyCODONE -acetaminophen  5-325 MG tablet Commonly known as: Percocet Take 1 tablet by mouth every 4 (four) hours as needed for severe pain.   rosuvastatin 20 MG tablet Commonly known as: CRESTOR Take 20 mg by mouth at bedtime.   tamsulosin  0.4 MG Caps capsule Commonly known as: FLOMAX  Take 1 capsule (0.4 mg total) by mouth daily.        Allergies: No Known Allergies  Family History  Problem Relation Age of Onset   Healthy Mother    Healthy Father     Social History:  reports that he has never smoked. He has never used smokeless tobacco. He reports  that he does not drink alcohol and does not use drugs.  ROS: A complete review of systems was performed.  All systems are negative except for pertinent findings as noted.  Physical Exam:  Vital signs in last 24 hours: There were no vitals taken for this visit. Constitutional:  Alert and oriented, No acute distress Cardiovascular: Regular rate  Respiratory: Normal respiratory effort GU: Phallus circumcised.  No lesions.  Testicles scrotal skin, epididymal structures normal.  No inguinal hernias.  Normal anal sphincter tone.  Prostate 30 g symmetric nonnodular nontender. Neurologic: Grossly intact, no focal deficits Psychiatric: Normal mood and affect  I have reviewed prior pt notes  I have reviewed urinalysis results--clear      Impression/Assessment:  Hematospermia   Plan:  -Causative factors of hematospermia discussed

## 2024-04-06 ENCOUNTER — Ambulatory Visit (INDEPENDENT_AMBULATORY_CARE_PROVIDER_SITE_OTHER): Admitting: Urology

## 2024-04-06 VITALS — BP 125/82 | HR 83 | Ht 69.0 in | Wt 215.0 lb

## 2024-04-06 DIAGNOSIS — N201 Calculus of ureter: Secondary | ICD-10-CM

## 2024-04-06 DIAGNOSIS — R361 Hematospermia: Secondary | ICD-10-CM | POA: Diagnosis not present

## 2024-04-06 LAB — MICROSCOPIC EXAMINATION

## 2024-04-06 LAB — URINALYSIS, ROUTINE W REFLEX MICROSCOPIC
Bilirubin, UA: NEGATIVE
Glucose, UA: NEGATIVE
Leukocytes,UA: NEGATIVE
Nitrite, UA: NEGATIVE
Specific Gravity, UA: >=1.03 — AB (ref 1.005–1.030)
Urobilinogen, Ur: 0.2 mg/dL (ref 0.2–1.0)
pH, UA: 5.5 (ref 5.0–7.5)
